# Patient Record
Sex: Female | Born: 1935
Health system: Southern US, Community
[De-identification: ages and names within clinical notes are randomized; demographics above are authoritative.]

## PROBLEM LIST (undated history)

## (undated) DIAGNOSIS — G2581 Restless legs syndrome: Secondary | ICD-10-CM

## (undated) DIAGNOSIS — C50412 Malignant neoplasm of upper-outer quadrant of left female breast: Secondary | ICD-10-CM

## (undated) DIAGNOSIS — R9389 Abnormal findings on diagnostic imaging of other specified body structures: Secondary | ICD-10-CM

## (undated) DIAGNOSIS — K219 Gastro-esophageal reflux disease without esophagitis: Secondary | ICD-10-CM

## (undated) DIAGNOSIS — R609 Edema, unspecified: Secondary | ICD-10-CM

## (undated) DIAGNOSIS — R079 Chest pain, unspecified: Secondary | ICD-10-CM

## (undated) DIAGNOSIS — I5032 Chronic diastolic (congestive) heart failure: Secondary | ICD-10-CM

## (undated) DIAGNOSIS — K635 Polyp of colon: Secondary | ICD-10-CM

## (undated) DIAGNOSIS — C50919 Malignant neoplasm of unspecified site of unspecified female breast: Secondary | ICD-10-CM

## (undated) DIAGNOSIS — J449 Chronic obstructive pulmonary disease, unspecified: Secondary | ICD-10-CM

## (undated) DIAGNOSIS — I130 Hypertensive heart and chronic kidney disease with heart failure and stage 1 through stage 4 chronic kidney disease, or unspecified chronic kidney disease: Secondary | ICD-10-CM

## (undated) DIAGNOSIS — I10 Essential (primary) hypertension: Secondary | ICD-10-CM

## (undated) DIAGNOSIS — N189 Chronic kidney disease, unspecified: Secondary | ICD-10-CM

## (undated) DIAGNOSIS — I480 Paroxysmal atrial fibrillation: Secondary | ICD-10-CM

## (undated) DIAGNOSIS — E039 Hypothyroidism, unspecified: Secondary | ICD-10-CM

## (undated) DIAGNOSIS — M5137 Other intervertebral disc degeneration, lumbosacral region: Secondary | ICD-10-CM

## (undated) DIAGNOSIS — R05 Cough: Secondary | ICD-10-CM

## (undated) DIAGNOSIS — E785 Hyperlipidemia, unspecified: Secondary | ICD-10-CM

## (undated) DIAGNOSIS — D510 Vitamin B12 deficiency anemia due to intrinsic factor deficiency: Secondary | ICD-10-CM

## (undated) DIAGNOSIS — M51379 Other intervertebral disc degeneration, lumbosacral region without mention of lumbar back pain or lower extremity pain: Secondary | ICD-10-CM

## (undated) DIAGNOSIS — I6529 Occlusion and stenosis of unspecified carotid artery: Secondary | ICD-10-CM

## (undated) HISTORY — DX: Chronic obstructive pulmonary disease, unspecified: J44.9

## (undated) HISTORY — DX: Abnormal findings on diagnostic imaging of other specified body structures: R93.89

## (undated) HISTORY — DX: Hypothyroidism, unspecified: E03.9

## (undated) HISTORY — DX: Chronic kidney disease, unspecified: N18.9

## (undated) HISTORY — DX: Chest pain, unspecified: R07.9

## (undated) HISTORY — DX: Cough: R05

## (undated) HISTORY — DX: Vitamin B12 deficiency anemia due to intrinsic factor deficiency: D51.0

## (undated) HISTORY — DX: Gastro-esophageal reflux disease without esophagitis: K21.9

## (undated) HISTORY — DX: Edema, unspecified: R60.9

## (undated) HISTORY — DX: Restless legs syndrome: G25.81

## (undated) HISTORY — DX: Chronic diastolic (congestive) heart failure: I50.32

## (undated) HISTORY — DX: Malignant neoplasm of unspecified site of unspecified female breast: C50.919

## (undated) HISTORY — DX: Polyp of colon: K63.5

## (undated) HISTORY — DX: Occlusion and stenosis of unspecified carotid artery: I65.29

## (undated) HISTORY — DX: Paroxysmal atrial fibrillation: I48.0

## (undated) HISTORY — DX: Hyperlipidemia, unspecified: E78.5

## (undated) HISTORY — DX: Essential (primary) hypertension: I10

## (undated) HISTORY — DX: Hypertensive heart and chronic kidney disease with heart failure and stage 1 through stage 4 chronic kidney disease, or unspecified chronic kidney disease: I13.0

## (undated) HISTORY — DX: Other intervertebral disc degeneration, lumbosacral region without mention of lumbar back pain or lower extremity pain: M51.379

## (undated) HISTORY — DX: Malignant neoplasm of upper-outer quadrant of left female breast: C50.412

## (undated) HISTORY — DX: Other intervertebral disc degeneration, lumbosacral region: M51.37

---

## 1968-07-26 HISTORY — PX: ABDOMINAL HYSTERECTOMY: SHX81

## 1978-07-26 HISTORY — PX: SPINE SURGERY: SHX786

## 2000-03-15 ENCOUNTER — Encounter (INDEPENDENT_AMBULATORY_CARE_PROVIDER_SITE_OTHER): Payer: Self-pay

## 2000-03-15 ENCOUNTER — Other Ambulatory Visit: Admission: RE | Admit: 2000-03-15 | Discharge: 2000-03-15 | Payer: Self-pay | Admitting: Internal Medicine

## 2007-07-27 HISTORY — PX: BREAST MASS EXCISION: SHX1267

## 2008-06-05 ENCOUNTER — Ambulatory Visit: Payer: Self-pay | Admitting: Internal Medicine

## 2008-06-18 ENCOUNTER — Encounter: Payer: Self-pay | Admitting: Internal Medicine

## 2008-06-18 ENCOUNTER — Ambulatory Visit: Payer: Self-pay | Admitting: Internal Medicine

## 2008-06-21 ENCOUNTER — Encounter: Payer: Self-pay | Admitting: Internal Medicine

## 2012-05-01 ENCOUNTER — Encounter: Payer: Self-pay | Admitting: Internal Medicine

## 2012-05-02 ENCOUNTER — Encounter: Payer: Self-pay | Admitting: Internal Medicine

## 2012-05-02 ENCOUNTER — Ambulatory Visit (INDEPENDENT_AMBULATORY_CARE_PROVIDER_SITE_OTHER): Payer: Medicare Other | Admitting: Internal Medicine

## 2012-05-02 VITALS — BP 124/82 | HR 63 | Temp 98.1°F | Ht 65.0 in | Wt 193.0 lb

## 2012-05-02 DIAGNOSIS — R9389 Abnormal findings on diagnostic imaging of other specified body structures: Secondary | ICD-10-CM

## 2012-05-02 DIAGNOSIS — R05 Cough: Secondary | ICD-10-CM

## 2012-05-02 DIAGNOSIS — R059 Cough, unspecified: Secondary | ICD-10-CM

## 2012-05-02 HISTORY — DX: Cough, unspecified: R05.9

## 2012-05-02 HISTORY — DX: Abnormal findings on diagnostic imaging of other specified body structures: R93.89

## 2012-05-02 MED ORDER — OMEPRAZOLE 20 MG PO CPDR: 20.0000 mg | DELAYED_RELEASE_CAPSULE | Freq: Every day | ORAL | Status: DC

## 2012-05-02 MED ORDER — FAMOTIDINE 20 MG PO TABS: ORAL_TABLET | ORAL | Status: DC

## 2012-05-02 NOTE — Progress Notes (Signed)
  Subjective:    Patient ID: Melanie Montes, female    DOB: 10-Oct-1935  MRN: [id number]  HPI  70 yowf quit smoking with cough improvement in 2002 referred 05/02/2012 to pulmonary clinic By Dr Delena Bali with new onset hemoptysis.   05/02/2012 1st pulmonary ov/ Alvino Lechuga cc one episode of ? Hemoptysis 6 months prior to OV  Then recurred on Sept 23 more with throat clearing while on baby aspirin which stopped for a week then resumed. Since sept 23 persistent cough and burning in throat with yellow mucus started on levaquin 10/8.  No assoc sob or epistaxis though does c/o seasonal nasal congestion, acitive, assoc with watery discharge. Convinced blood came from throat, not lungs or nose. No obvious daytime variabilty or assoc  cp or chest tightness, subjective wheeze overt sinus or hb symptoms. No unusual exp hx or h/o childhood pna/ asthma or premature birth to her knowledge.   Sleeping ok without nocturnal  or early am exacerbation  of respiratory  c/o's or need for noct saba. Also denies any obvious fluctuation of symptoms with weather or environmental changes or other aggravating or alleviating factors except as outlined above    Review of Systems  Constitutional: Negative for fever, chills and unexpected weight change.  HENT: Positive for congestion, sore throat, rhinorrhea and postnasal drip. Negative for ear pain, nosebleeds, sneezing, trouble swallowing, dental problem, voice change and sinus pressure.   Eyes: Negative for visual disturbance.  Respiratory: Positive for cough. Negative for choking and shortness of breath.   Cardiovascular: Negative for chest pain and leg swelling.  Gastrointestinal: Negative for vomiting, abdominal pain and diarrhea.  Genitourinary: Negative for difficulty urinating.  Musculoskeletal: Negative for arthralgias.  Skin: Negative for rash.  Neurological: Negative for tremors, syncope and headaches.  Hematological: Does not bruise/bleed easily.       Objective:   Physical Exam  amb wf nad  HEENT mild turbinate edema.  Oropharynx no thrush or excess pnd or cobblestoning. Full dentures. No JVD or cervical adenopathy. Mild accessory muscle hypertrophy. Trachea midline, nl thryroid. Chest was hyperinflated by percussion with diminished breath sounds and moderate increased exp time without wheeze. Hoover sign positive at mid inspiration. Regular rate and rhythm without murmur gallop or rub or increase P2 or edema.  Abd: no hsm, nl excursion. Ext warm without cyanosis or clubbing.    Outside cxr reports reviewed > wnl      Assessment & Plan:

## 2012-05-02 NOTE — Patient Instructions (Addendum)
Try prilosec 20mg   Take 30-60 min before first meal of the day and Pepcid 20 mg one bedtime until cough is completely gone for at least a week without the need for cough suppression  Finish the levaquin - stop for aches joints, especially joints or numbness   GERD (REFLUX)  is an extremely common cause of respiratory symptoms, many times with no significant heartburn at all.    It can be treated with medication, but also with lifestyle changes including avoidance of late meals, excessive alcohol, smoking cessation, and avoid fatty foods, chocolate, peppermint, colas, red wine, and acidic juices such as orange juice.  NO MINT OR MENTHOL PRODUCTS SO NO COUGH DROPS  USE SUGARLESS CANDY INSTEAD (jolley ranchers or Stover's)  NO OIL BASED VITAMINS and supplements- use powdered substitute   Please schedule a follow up office visit in 4 weeks, sooner if needed with pft's and cxr

## 2012-05-03 NOTE — Assessment & Plan Note (Signed)
-   CT Chest 04/17/12:  Bilateral clustered nodules both upper lobes and sup segment of lower lobes  Most likely has low grade mai perhaps with hemoptysis on this basis but agree with short term levquin only using the concept of the punishment needs to fit the crime and for right now persuing these nodular densities is not in her best interest in this context.

## 2012-05-03 NOTE — Assessment & Plan Note (Signed)
The most common causes of chronic cough in immunocompetent adults include the following: upper airway cough syndrome (UACS), previously referred to as postnasal drip syndrome (PNDS), which is caused by variety of rhinosinus conditions; (2) asthma; (3) GERD; (4) chronic bronchitis from cigarette smoking or other inhaled environmental irritants; (5) nonasthmatic eosinophilic bronchitis; and (6) bronchiectasis.   These conditions, singly or in combination, have accounted for up to 94% of the causes of chronic cough in prospective studies.   Other conditions have constituted no >6% of the causes in prospective studies These have included bronchogenic carcinoma, chronic interstitial pneumonia, sarcoidosis, left ventricular failure, ACEI-induced cough, and aspiration from a condition associated with pharyngeal dysfunction.   Most likely this is  Classic Upper airway cough syndrome, so named because it's frequently impossible to sort out how much is  CR/sinusitis with freq throat clearing (which can be related to primary GERD)   vs  causing  secondary (" extra esophageal")  GERD from wide swings in gastric pressure that occur with throat clearing, often  promoting self use of mint and menthol lozenges that reduce the lower esophageal sphincter tone and exacerbate the problem further in a cyclical fashion.   These are the same pts (now being labeled as having "irritable larynx syndrome" by some cough centers) who not infrequently have a history of having failed to tolerate ace inhibitors,  dry powder inhalers or biphosphonates or report having atypical reflux symptoms that don't respond to standard doses of PPI , and are easily confused as having aecopd or asthma flares by even experienced allergists/ pulmonologists. rx for gerd with ppi and diet and then regroup with pft's and cxr in 4-6 weeks

## 2012-06-02 ENCOUNTER — Ambulatory Visit (INDEPENDENT_AMBULATORY_CARE_PROVIDER_SITE_OTHER): Payer: Medicare Other | Admitting: Internal Medicine

## 2012-06-02 ENCOUNTER — Ambulatory Visit (INDEPENDENT_AMBULATORY_CARE_PROVIDER_SITE_OTHER)
Admission: RE | Admit: 2012-06-02 | Discharge: 2012-06-02 | Disposition: A | Discharge status: Home / Self Care | Payer: Medicare Other | Source: Ambulatory Visit | Attending: Internal Medicine | Admitting: Internal Medicine

## 2012-06-02 ENCOUNTER — Encounter: Payer: Self-pay | Admitting: Internal Medicine

## 2012-06-02 VITALS — BP 106/60 | HR 69 | Temp 97.7°F | Ht 65.0 in | Wt 192.0 lb

## 2012-06-02 DIAGNOSIS — R9389 Abnormal findings on diagnostic imaging of other specified body structures: Secondary | ICD-10-CM

## 2012-06-02 DIAGNOSIS — R05 Cough: Secondary | ICD-10-CM

## 2012-06-02 DIAGNOSIS — J449 Chronic obstructive pulmonary disease, unspecified: Secondary | ICD-10-CM | POA: Insufficient documentation

## 2012-06-02 HISTORY — DX: Chronic obstructive pulmonary disease, unspecified: J44.9

## 2012-06-02 LAB — PULMONARY FUNCTION TEST

## 2012-06-02 IMAGING — CR DG CHEST 2V
2 series · 2 of 2 positions shown · non-contrast
Comparison: None.

CLINICAL DATA: Cough.  Follow up pneumonia.

CHEST - 2 VIEW

[view not recorded (1 of 2)]
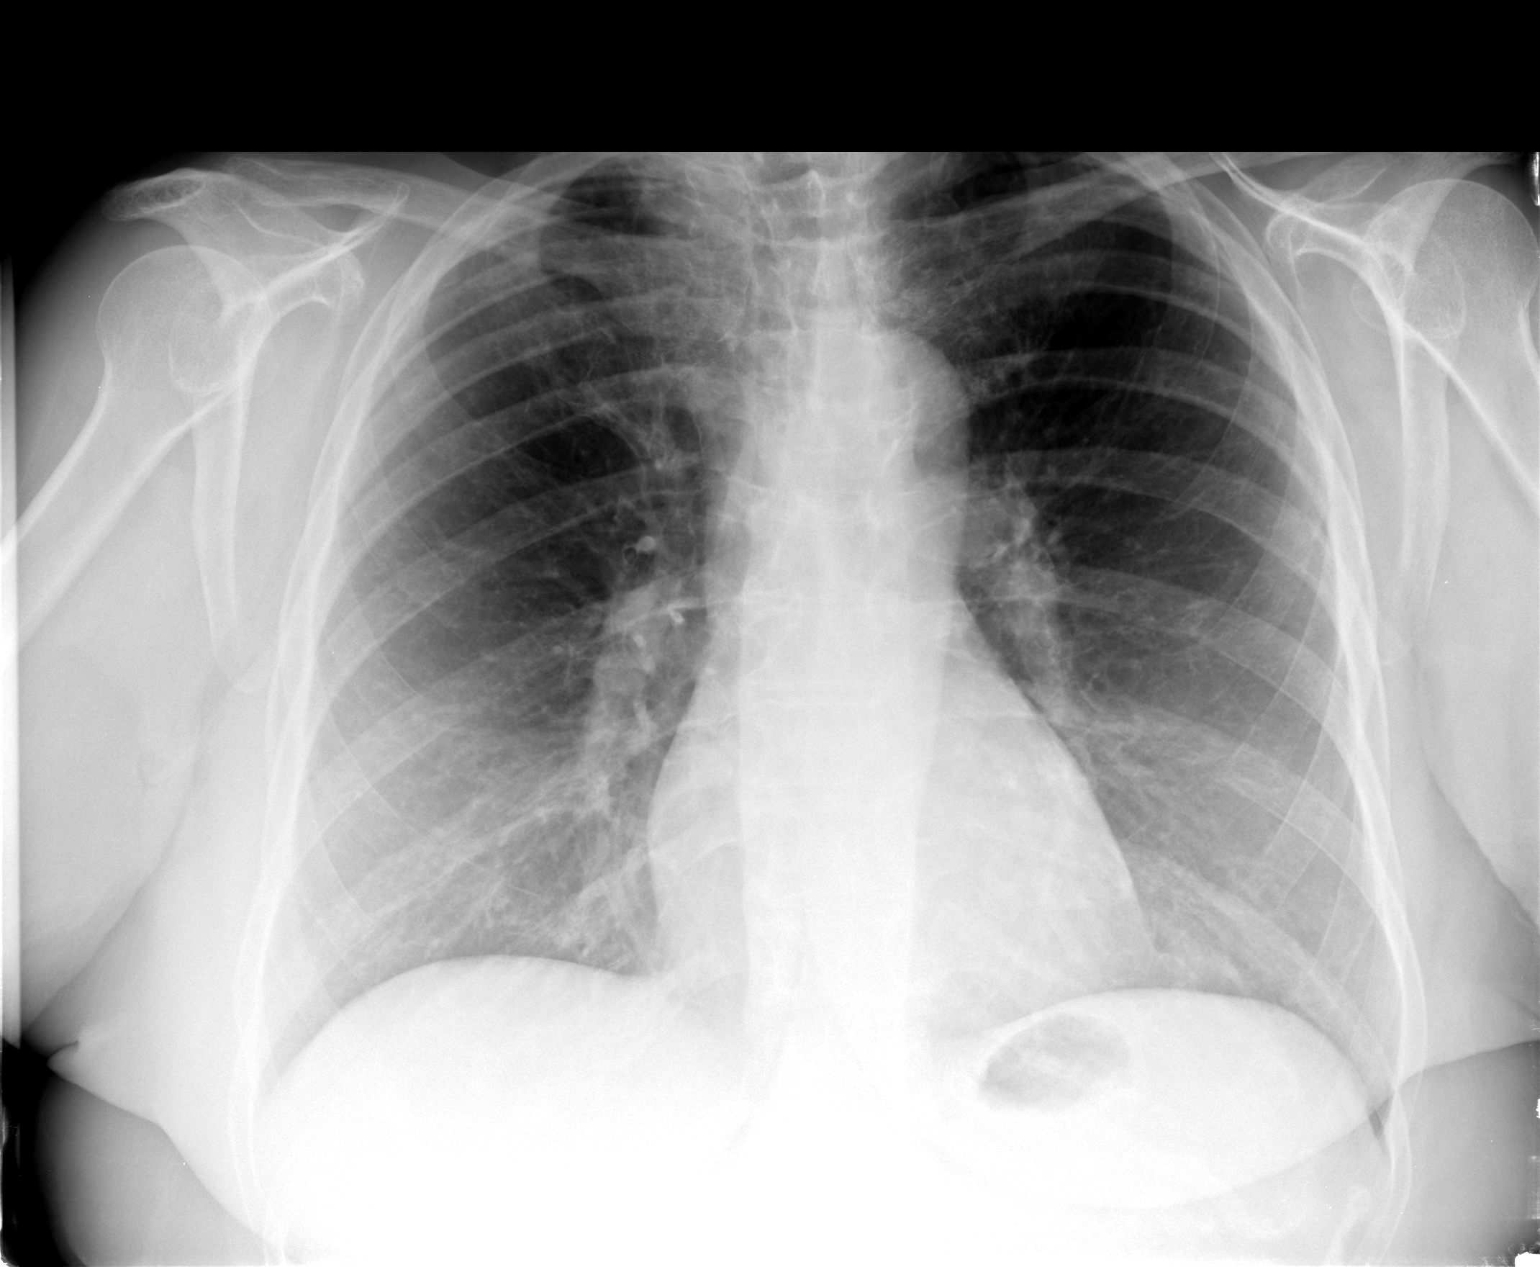

[view not recorded (2 of 2)]
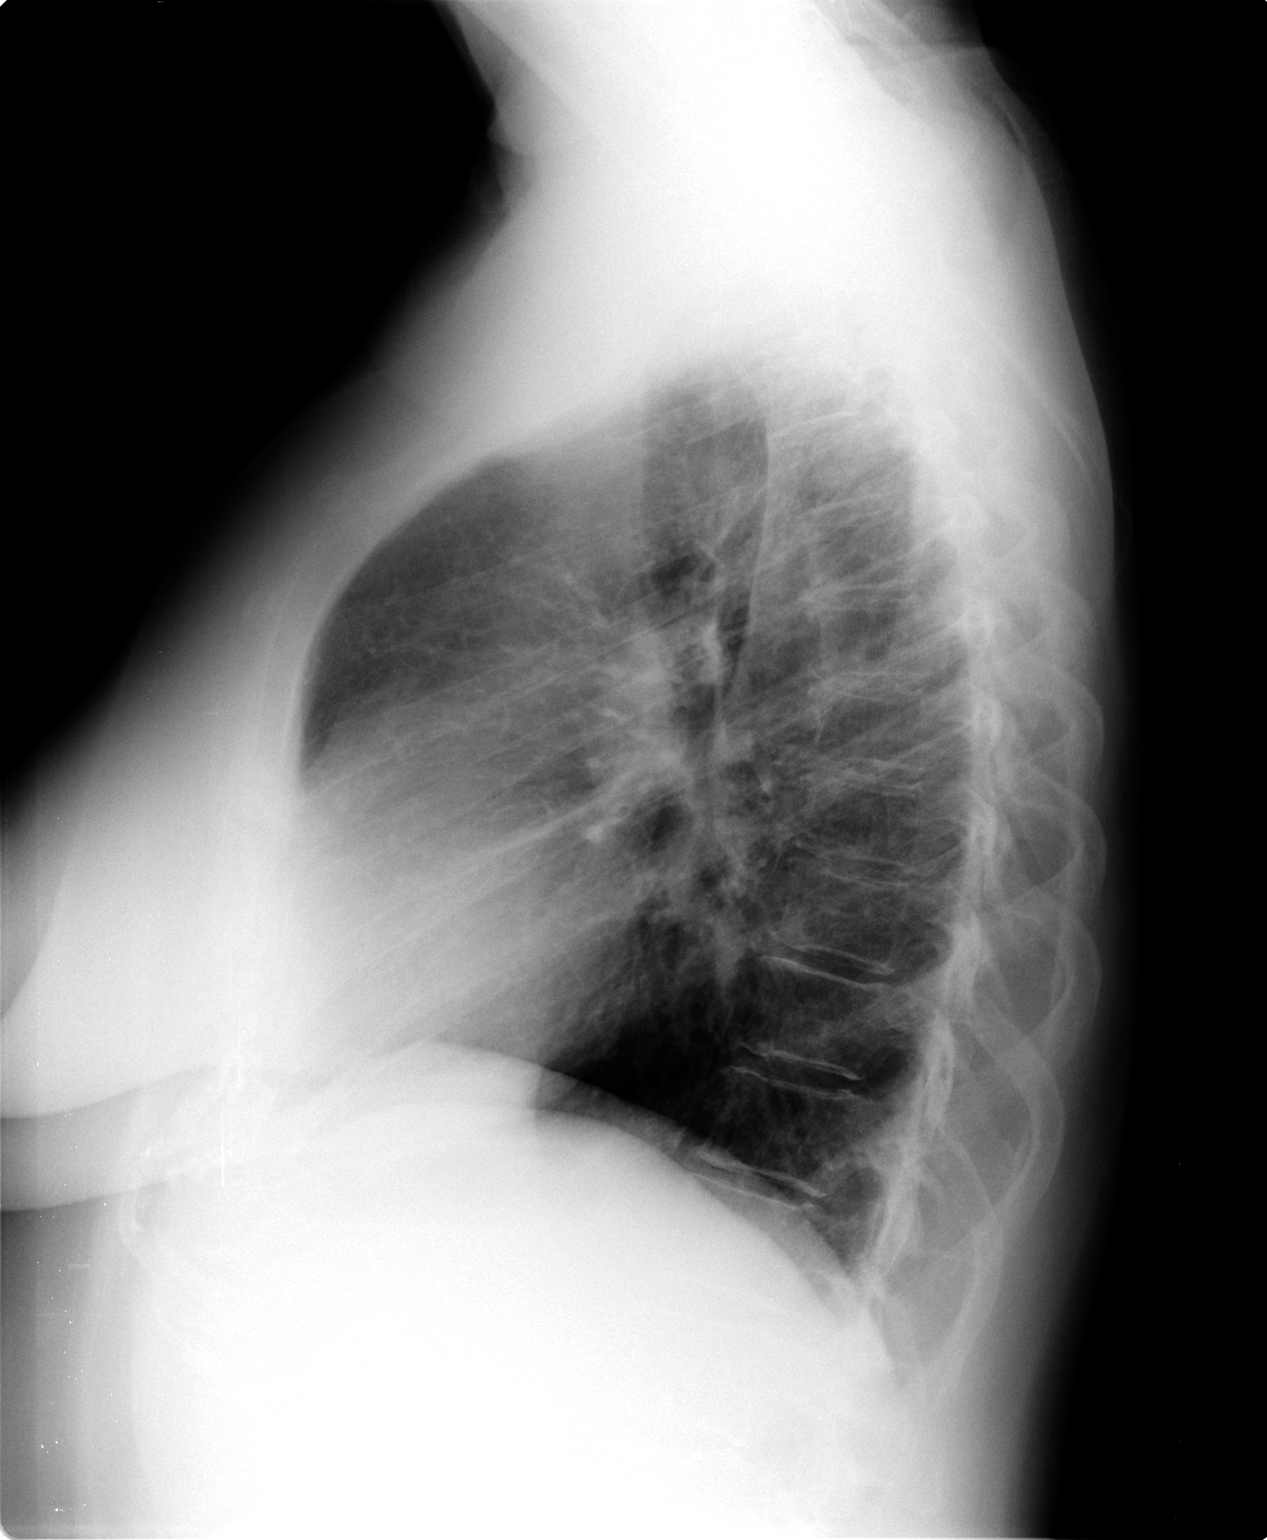

[2 of 2 positions shown; findings below may reference images not displayed]

FINDINGS: Cardiopericardial silhouette within normal limits.
Mediastinal contours normal. Trachea midline.  No airspace disease
or effusion.
IMPRESSION: No active cardiopulmonary disease.

## 2012-06-02 NOTE — Assessment & Plan Note (Signed)
-   CT Chest 04/17/12:  Bilateral clustered nodules both upper lobes and sup segment of lower lobes  Very well could have MAI but not viz on plain cxr and no indication for rx even if she has it  Therefore rec recheck cxr in 6 months

## 2012-06-02 NOTE — Progress Notes (Signed)
  Subjective:    Patient ID: Melanie Montes, female    DOB: [birth year]  MRN: [id number]  HPI  38 yowf quit smoking with cough improvement in 2002 referred 05/02/2012 to pulmonary clinic By Dr Delena Bali with new onset hemoptysis.   05/02/2012 1st pulmonary ov/ Melanie Montes cc one episode of ? Hemoptysis 6 months prior to OV  Then recurred on Sept 23 more with throat clearing while on baby aspirin which stopped for a week then resumed. Since sept 23 persistent cough and burning in throat with yellow mucus started on levaquin 10/8.  No assoc sob or epistaxis though does c/o seasonal nasal congestion, acitive, assoc with watery discharge. Convinced blood came from throat, not lungs or nose. rec Try prilosec 20mg   Take 30-60 min before first meal of the day and Pepcid 20 mg one bedtime until cough is completely gone for at least a week without the need for cough suppression Finish the levaquin - stop for aches joints, especially joints or numbness  GERD (REFLUX)  06/02/2012 f/u ov/Melanie Montes cc 100% better, no cough, no more hemoptysis back on baby asa.  No cp or chest tightness, subjective wheeze overt sinus or hb symptoms. No unusual exp hx or h/o childhood pna/ asthma or premature birth to her knowledge.   Sleeping ok without nocturnal  or early am exacerbation  of respiratory  c/o's or need for noct saba. Also denies any obvious fluctuation of symptoms with weather or environmental changes or other aggravating or alleviating factors except as outlined above          Objective:   Physical Exam  Wt Readings from Last 3 Encounters:  06/02/12 192 lb (87.091 kg)  05/02/12 193 lb (87.544 kg)     amb wf nad  HEENT mild turbinate edema.  Oropharynx no thrush or excess pnd or cobblestoning. Full dentures. No JVD or cervical adenopathy. Mild accessory muscle hypertrophy. Trachea midline, nl thryroid. Chest was hyperinflated by percussion with diminished breath sounds and moderate increased exp time without  wheeze. Hoover sign positive at mid inspiration. Regular rate and rhythm without murmur gallop or rub or increase P2 or edema.  Abd: no hsm, nl excursion. Ext warm without cyanosis or clubbing.     CXR  06/02/2012 : No active cardiopulmonary disease.        Assessment & Plan:

## 2012-06-02 NOTE — Patient Instructions (Addendum)
You will need a follow up cxr in 6 months (placed on recall file)  In the meantime if the bleeding comes back call me to set up a look at your bronchial tubes (bronchoscopy) at [facility]

## 2012-06-02 NOTE — Assessment & Plan Note (Signed)
Resolved with rx of gerd, c/w  Classic Upper airway cough syndrome, so named because it's frequently impossible to sort out how much is  CR/sinusitis with freq throat clearing (which can be related to primary GERD)   vs  causing  secondary (" extra esophageal")  GERD from wide swings in gastric pressure that occur with throat clearing, often  promoting self use of mint and menthol lozenges that reduce the lower esophageal sphincter tone and exacerbate the problem further in a cyclical fashion.   These are the same pts (now being labeled as having "irritable larynx syndrome" by some cough centers) who not infrequently have a history of having failed to tolerate ace inhibitors,  dry powder inhalers or biphosphonates or report having atypical reflux symptoms that don't respond to standard doses of PPI , and are easily confused as having aecopd or asthma flares by even experienced allergists/ pulmonologists.   For now continue gerd rx

## 2012-06-02 NOTE — Assessment & Plan Note (Signed)
-   PFT's 06/02/2012 FEV1  1.72 (86%) ratio 68 with marked air trapping  And DLCO 67%  I reviewed the Flethcher curve with patient that basically indicates  if you quit smoking when your best day FEV1 is still well preserved (which she did)  it is highly unlikely you will progress to severe disease and informed the patient there was no medication on the market that has proven to change the curve or the likelihood of progression.  Therefore stopping smoking and maintaining abstinence is the most important aspect of care, not choice of inhalers or for that matter, doctors.    Pulmonary f/u and meds are prn in this setting

## 2012-06-02 NOTE — Progress Notes (Signed)
PFT done today. Katie Welchel,CMA  

## 2012-10-26 ENCOUNTER — Telehealth: Payer: Self-pay | Admitting: *Deleted

## 2012-10-26 DIAGNOSIS — R9389 Abnormal findings on diagnostic imaging of other specified body structures: Secondary | ICD-10-CM

## 2012-10-26 DIAGNOSIS — R05 Cough: Secondary | ICD-10-CM

## 2012-10-26 NOTE — Telephone Encounter (Signed)
Spoke with pt and she agrees to have cxr done, but wants this done in [place] at [facility] I will send order to [facility] and advised we will call her with results once he reviews She verbalized understanding and denies any questions

## 2012-10-26 NOTE — Telephone Encounter (Signed)
Message copied by Rosana Berger on Thu Oct 26, 2012  9:06 AM ------      Message from: Tanda Rockers      Created: Fri Jun 02, 2012 10:07 AM       Make sure she's a cxr by now (ok to do in [place]) ------

## 2012-12-28 ENCOUNTER — Ambulatory Visit (INDEPENDENT_AMBULATORY_CARE_PROVIDER_SITE_OTHER): Payer: Medicare Other | Admitting: Internal Medicine

## 2012-12-28 ENCOUNTER — Encounter: Payer: Self-pay | Admitting: Internal Medicine

## 2012-12-28 VITALS — BP 140/84 | HR 73 | Temp 98.0°F | Ht 64.0 in | Wt 210.6 lb

## 2012-12-28 DIAGNOSIS — R9389 Abnormal findings on diagnostic imaging of other specified body structures: Secondary | ICD-10-CM

## 2012-12-28 NOTE — Progress Notes (Signed)
  Subjective:    Patient ID: Melanie Montes, female    DOB: May 05, 1936  MRN: [id number]  HPI  20 yowf quit smoking with cough improvement in 2002 referred 05/02/2012 to pulmonary clinic By Dr Delena Bali with new onset hemoptysis.   05/02/2012 1st pulmonary ov/ Melanie Montes cc one episode of ? Hemoptysis 6 months prior to OV  Then recurred on Sept 23 more with throat clearing while on baby aspirin which stopped for a week then resumed. Since sept 23 persistent cough and burning in throat with yellow mucus started on levaquin 10/8.  No assoc sob or epistaxis though does c/o seasonal nasal congestion, acitive, assoc with watery discharge. Convinced blood came from throat, not lungs or nose. rec Try prilosec 20mg   Take 30-60 min before first meal of the day and Pepcid 20 mg one bedtime until cough is completely gone for at least a week without the need for cough suppression Finish the levaquin - stop for aches joints, especially joints or numbness  GERD (REFLUX)  06/02/2012 f/u ov/Melanie Montes cc 100% better, no cough, no more hemoptysis back on baby asa. rec No change rx    12/28/2012 f/u ov/Melanie Montes re mpn/ cough Chief Complaint  Patient presents with  . Follow-up    Pt reports coughing is gone-- denies any other concerns at this time      No cp or chest tightness, subjective wheeze overt sinus or hb symptoms. No unusual exp hx or h/o childhood pna/ asthma or premature birth to her knowledge.   Sleeping ok without nocturnal  or early am exacerbation  of respiratory  c/o's or need for noct saba. Also denies any obvious fluctuation of symptoms with weather or environmental changes or other aggravating or alleviating factors except as outlined above.  Current Medications, Allergies, Past Medical History, Past Surgical History, Family History, and Social History were reviewed in [employer] record.  ROS  The following are not active complaints unless bolded sore throat, dysphagia, dental  problems, itching, sneezing,  nasal congestion or excess/ purulent secretions, ear ache,   fever, chills, sweats, unintended wt loss, pleuritic or exertional cp, hemoptysis,  orthopnea pnd or leg swelling, presyncope, palpitations, heartburn, abdominal pain, anorexia, nausea, vomiting, diarrhea  or change in bowel or urinary habits, change in stools or urine, dysuria,hematuria,  rash, arthralgias, visual complaints, headache, numbness weakness or ataxia or problems with walking or coordination,  change in mood/affect or memory.              Objective:   Physical Exam  12/28/2012         210  Wt Readings from Last 3 Encounters:  06/02/12 192 lb (87.091 kg)  05/02/12 193 lb (87.544 kg)     amb wf nad  HEENT mild turbinate edema.  Oropharynx no thrush or excess pnd or cobblestoning. Full dentures. No JVD or cervical adenopathy. Mild accessory muscle hypertrophy. Trachea midline, nl thryroid. Chest was hyperinflated by percussion with diminished breath sounds and moderate increased exp time without wheeze. Hoover sign positive at mid inspiration. Regular rate and rhythm without murmur gallop or rub or increase P2 or edema.  Abd: no hsm, nl excursion. Ext warm without cyanosis or clubbing.     CXR  06/02/2012 : No active cardiopulmonary disease.        Assessment & Plan:

## 2012-12-28 NOTE — Patient Instructions (Addendum)
I recommend a cxr at 6 months then yearly   Return here if cough recurs or any changes on your chest xray.

## 2012-12-29 NOTE — Assessment & Plan Note (Signed)
-   CT Chest 04/17/12:  Bilateral clustered nodules both upper lobes and sup segment of lower lobes  I had an extended summary  discussion with the patient today lasting 15 to 20 minutes of a 25 minute visit on the following issues:  We see scores of pts with MPN many of whom probably have low grade MAI but unless there is convincing clinical deterioration or plain film abnormalities that evolve over time there is not indication for treatment so there is no indication for invasive procedure to document the cause.  Therefore a cxr in 6 months then yearly is all I would recommend in the absence of recurrent symptoms of cough, wt loss, worse doe et.  Pulmonary f/u can be prn

## 2013-05-16 ENCOUNTER — Encounter: Payer: Self-pay | Admitting: Internal Medicine

## 2013-11-16 ENCOUNTER — Encounter: Payer: Self-pay | Admitting: Internal Medicine

## 2013-12-25 ENCOUNTER — Ambulatory Visit (AMBULATORY_SURGERY_CENTER): Payer: Medicare Other

## 2013-12-25 VITALS — Ht 64.25 in | Wt 195.0 lb

## 2013-12-25 DIAGNOSIS — Z860100 Personal history of colon polyps, unspecified: Secondary | ICD-10-CM

## 2013-12-25 MED ORDER — MOVIPREP 100 G PO SOLR: 1.0000 | Freq: Once | ORAL | Status: DC

## 2013-12-25 NOTE — Progress Notes (Signed)
No allergies to eggs or soy No home oxygen No diet/weight loss meds No email No past problems with anesthesia

## 2014-01-09 ENCOUNTER — Encounter: Payer: Self-pay | Admitting: Internal Medicine

## 2014-01-09 ENCOUNTER — Ambulatory Visit (AMBULATORY_SURGERY_CENTER): Payer: Medicare Other | Admitting: Internal Medicine

## 2014-01-09 VITALS — BP 150/67 | HR 68 | Temp 98.2°F | Resp 18 | Ht 64.0 in | Wt 195.0 lb

## 2014-01-09 DIAGNOSIS — Z86010 Personal history of colonic polyps: Secondary | ICD-10-CM

## 2014-01-09 DIAGNOSIS — D126 Benign neoplasm of colon, unspecified: Secondary | ICD-10-CM

## 2014-01-09 MED ORDER — SODIUM CHLORIDE 0.9 % IV SOLN: 500.0000 mL | INTRAVENOUS | Status: DC

## 2014-01-09 NOTE — Progress Notes (Signed)
Called to room to assist during endoscopic procedure.  Patient ID and intended procedure confirmed with present staff. Received instructions for my participation in the procedure from the performing physician.  

## 2014-01-09 NOTE — Progress Notes (Signed)
Procedure ends, to recovery, report given and VSS. 

## 2014-01-09 NOTE — Op Note (Signed)
[place]  [address]. [place], [id number]   COLONOSCOPY PROCEDURE REPORT  PATIENT: Melanie Montes, Melanie Montes  MR#: [id number] BIRTHDATE: 08/20/1935 , 77  yrs. old GENDER: Female ENDOSCOPIST: Eustace Quail, MD REFERRED [id number] Program Recall PROCEDURE DATE:  01/09/2014 PROCEDURE:   Colonoscopy with snare polypectomy x 4 First Screening Colonoscopy - Avg.  risk and is 50 yrs.  old or older - No.  Prior Negative Screening - Now for repeat screening. N/A  History of Adenoma - Now for follow-up colonoscopy & has been > or = to 3 yrs.  Yes hx of adenoma.  Has been 3 or more years since last colonoscopy.  Polyps Removed Today? Yes. ASA CLASS:   Class II INDICATIONS:Patient's father with CRC and  personal history of adenomatous polyps (Multiple prior 1998 --->2009). MEDICATIONS: MAC sedation, administered by CRNA and propofol (Diprivan) 300mg  IV  DESCRIPTION OF PROCEDURE:   After the risks benefits and alternatives of the procedure were thoroughly explained, informed consent was obtained.  A digital rectal exam revealed no abnormalities of the rectum.   The LB [id number]  endoscope was introduced through the anus and advanced to the cecum, which was identified by both the appendix and ileocecal valve. No adverse events experienced.   The quality of the prep was excellent, using MoviPrep  The instrument was then slowly withdrawn as the colon was fully examined.  COLON FINDINGS: Four diminutive polyps were found at the cecum, in the ascending colon, and transverse colon.  A polypectomy was performed with a cold snare.  The resection was complete and the polyp tissue was completely retrieved.   Moderate diverticulosis was noted in the sigmoid colon.   The colon mucosa was otherwise normal.  Retroflexed views revealed no abnormalities. The time to cecum=2 minutes 38 seconds.  Withdrawal time=9 minutes 57 seconds. The scope was withdrawn and the procedure  completed. COMPLICATIONS: There were no complications.  ENDOSCOPIC IMPRESSION: 1.   Four diminutive polyps were found in the colon; polypectomy was performed with a cold snare 2.   Moderate diverticulosis was noted in the sigmoid colon 3.   The colon mucosa was otherwise normal  RECOMMENDATIONS: 1. Follow up colonoscopy in 5 years   eSigned:  Eustace Quail, MD 01/09/2014 11:06 AM   cc: The Patient and Nelda Bucks, MD

## 2014-01-09 NOTE — Patient Instructions (Signed)
Discharge instructions given with verbal understanding. Handout on polyps. Resume previous medications. YOU HAD AN ENDOSCOPIC PROCEDURE TODAY AT THE [facility]: Refer to the procedure report that was given to you for any specific questions about what was found during the examination.  If the procedure report does not answer your questions, please call your gastroenterologist to clarify.  If you requested that your care partner not be given the details of your procedure findings, then the procedure report has been included in a sealed envelope for you to review at your convenience later.  YOU SHOULD EXPECT: Some feelings of bloating in the abdomen. Passage of more gas than usual.  Walking can help get rid of the air that was put into your GI tract during the procedure and reduce the bloating. If you had a lower endoscopy (such as a colonoscopy or flexible sigmoidoscopy) you may notice spotting of blood in your stool or on the toilet paper. If you underwent a bowel prep for your procedure, then you may not have a normal bowel movement for a few days.  DIET: Your first meal following the procedure should be a light meal and then it is ok to progress to your normal diet.  A half-sandwich or bowl of soup is an example of a good first meal.  Heavy or fried foods are harder to digest and may make you feel nauseous or bloated.  Likewise meals heavy in dairy and vegetables can cause extra gas to form and this can also increase the bloating.  Drink plenty of fluids but you should avoid alcoholic beverages for 24 hours.  ACTIVITY: Your care partner should take you home directly after the procedure.  You should plan to take it easy, moving slowly for the rest of the day.  You can resume normal activity the day after the procedure however you should NOT DRIVE or use heavy machinery for 24 hours (because of the sedation medicines used during the test).    SYMPTOMS TO REPORT IMMEDIATELY: A  gastroenterologist can be reached at any hour.  During normal business hours, 8:30 AM to 5:00 PM Monday through Friday, call [phone].  After hours and on weekends, please call the GI answering service at [phone] who will take a message and have the physician on call contact you.   Following lower endoscopy (colonoscopy or flexible sigmoidoscopy):  Excessive amounts of blood in the stool  Significant tenderness or worsening of abdominal pains  Swelling of the abdomen that is new, acute  Fever of 100F or higher  FOLLOW UP: If any biopsies were taken you will be contacted by phone or by letter within the next 1-3 weeks.  Call your gastroenterologist if you have not heard about the biopsies in 3 weeks.  Our staff will call the home number listed on your records the next business day following your procedure to check on you and address any questions or concerns that you may have at that time regarding the information given to you following your procedure. This is a courtesy call and so if there is no answer at the home number and we have not heard from you through the emergency physician on call, we will assume that you have returned to your regular daily activities without incident.  SIGNATURES/CONFIDENTIALITY: You and/or your care partner have signed paperwork which will be entered into your electronic medical record.  These signatures attest to the fact that that the information above on your After Visit Summary has been   reviewed and is understood.  Full responsibility of the confidentiality of this discharge information lies with you and/or your care-partner. 

## 2014-01-10 ENCOUNTER — Telehealth: Payer: Self-pay | Admitting: *Deleted

## 2014-01-10 NOTE — Telephone Encounter (Signed)
  Follow up Call-  Call back number 01/09/2014  Post procedure Call Back phone  # [phone]  Permission to leave phone message Yes     Patient questions:  Do you have a fever, pain , or abdominal swelling? no Pain Score  0 *  Have you tolerated food without any problems? yes  Have you been able to return to your normal activities? yes  Do you have any questions about your discharge instructions: Diet   no Medications  no Follow up visit  no  Do you have questions or concerns about your Care? no  Actions: * If pain score is 4 or above: No action needed, pain <4.

## 2014-01-15 ENCOUNTER — Encounter: Payer: Self-pay | Admitting: Internal Medicine

## 2014-08-12 DIAGNOSIS — Z170 Estrogen receptor positive status [ER+]: Secondary | ICD-10-CM | POA: Diagnosis not present

## 2014-08-12 DIAGNOSIS — Z853 Personal history of malignant neoplasm of breast: Secondary | ICD-10-CM | POA: Diagnosis not present

## 2014-08-12 DIAGNOSIS — D518 Other vitamin B12 deficiency anemias: Secondary | ICD-10-CM | POA: Diagnosis not present

## 2014-09-06 DIAGNOSIS — Z6832 Body mass index (BMI) 32.0-32.9, adult: Secondary | ICD-10-CM | POA: Diagnosis not present

## 2014-09-06 DIAGNOSIS — E785 Hyperlipidemia, unspecified: Secondary | ICD-10-CM | POA: Diagnosis not present

## 2014-09-06 DIAGNOSIS — N183 Chronic kidney disease, stage 3 (moderate): Secondary | ICD-10-CM | POA: Diagnosis not present

## 2014-09-06 DIAGNOSIS — I10 Essential (primary) hypertension: Secondary | ICD-10-CM | POA: Diagnosis not present

## 2014-09-06 DIAGNOSIS — D510 Vitamin B12 deficiency anemia due to intrinsic factor deficiency: Secondary | ICD-10-CM | POA: Diagnosis not present

## 2014-09-06 DIAGNOSIS — I5032 Chronic diastolic (congestive) heart failure: Secondary | ICD-10-CM | POA: Diagnosis not present

## 2014-09-06 DIAGNOSIS — Z760 Encounter for issue of repeat prescription: Secondary | ICD-10-CM | POA: Diagnosis not present

## 2014-09-11 DIAGNOSIS — Z170 Estrogen receptor positive status [ER+]: Secondary | ICD-10-CM | POA: Diagnosis not present

## 2014-09-11 DIAGNOSIS — C50412 Malignant neoplasm of upper-outer quadrant of left female breast: Secondary | ICD-10-CM | POA: Diagnosis not present

## 2014-09-11 DIAGNOSIS — D518 Other vitamin B12 deficiency anemias: Secondary | ICD-10-CM | POA: Diagnosis not present

## 2014-10-10 DIAGNOSIS — Z170 Estrogen receptor positive status [ER+]: Secondary | ICD-10-CM | POA: Diagnosis not present

## 2014-10-10 DIAGNOSIS — C50412 Malignant neoplasm of upper-outer quadrant of left female breast: Secondary | ICD-10-CM | POA: Diagnosis not present

## 2014-10-10 DIAGNOSIS — D518 Other vitamin B12 deficiency anemias: Secondary | ICD-10-CM | POA: Diagnosis not present

## 2014-11-11 DIAGNOSIS — Z170 Estrogen receptor positive status [ER+]: Secondary | ICD-10-CM | POA: Diagnosis not present

## 2014-11-11 DIAGNOSIS — D518 Other vitamin B12 deficiency anemias: Secondary | ICD-10-CM | POA: Diagnosis not present

## 2014-11-11 DIAGNOSIS — C50412 Malignant neoplasm of upper-outer quadrant of left female breast: Secondary | ICD-10-CM | POA: Diagnosis not present

## 2014-12-05 DIAGNOSIS — I10 Essential (primary) hypertension: Secondary | ICD-10-CM | POA: Diagnosis not present

## 2014-12-05 DIAGNOSIS — C50912 Malignant neoplasm of unspecified site of left female breast: Secondary | ICD-10-CM | POA: Diagnosis not present

## 2014-12-05 DIAGNOSIS — N183 Chronic kidney disease, stage 3 (moderate): Secondary | ICD-10-CM | POA: Diagnosis not present

## 2014-12-05 DIAGNOSIS — D510 Vitamin B12 deficiency anemia due to intrinsic factor deficiency: Secondary | ICD-10-CM | POA: Diagnosis not present

## 2014-12-05 DIAGNOSIS — I5032 Chronic diastolic (congestive) heart failure: Secondary | ICD-10-CM | POA: Diagnosis not present

## 2014-12-05 DIAGNOSIS — Z6829 Body mass index (BMI) 29.0-29.9, adult: Secondary | ICD-10-CM | POA: Diagnosis not present

## 2014-12-05 DIAGNOSIS — E785 Hyperlipidemia, unspecified: Secondary | ICD-10-CM | POA: Diagnosis not present

## 2014-12-05 DIAGNOSIS — D631 Anemia in chronic kidney disease: Secondary | ICD-10-CM | POA: Diagnosis not present

## 2014-12-10 DIAGNOSIS — Z170 Estrogen receptor positive status [ER+]: Secondary | ICD-10-CM | POA: Diagnosis not present

## 2014-12-10 DIAGNOSIS — C50412 Malignant neoplasm of upper-outer quadrant of left female breast: Secondary | ICD-10-CM | POA: Diagnosis not present

## 2014-12-10 DIAGNOSIS — D518 Other vitamin B12 deficiency anemias: Secondary | ICD-10-CM | POA: Diagnosis not present

## 2014-12-12 DIAGNOSIS — I5030 Unspecified diastolic (congestive) heart failure: Secondary | ICD-10-CM | POA: Diagnosis not present

## 2014-12-12 DIAGNOSIS — R079 Chest pain, unspecified: Secondary | ICD-10-CM | POA: Diagnosis not present

## 2014-12-12 DIAGNOSIS — I10 Essential (primary) hypertension: Secondary | ICD-10-CM | POA: Diagnosis not present

## 2014-12-30 DIAGNOSIS — R079 Chest pain, unspecified: Secondary | ICD-10-CM | POA: Diagnosis not present

## 2014-12-30 DIAGNOSIS — R0600 Dyspnea, unspecified: Secondary | ICD-10-CM | POA: Diagnosis not present

## 2015-01-02 DIAGNOSIS — R922 Inconclusive mammogram: Secondary | ICD-10-CM | POA: Diagnosis not present

## 2015-01-02 DIAGNOSIS — C50919 Malignant neoplasm of unspecified site of unspecified female breast: Secondary | ICD-10-CM | POA: Diagnosis not present

## 2015-01-10 DIAGNOSIS — Z170 Estrogen receptor positive status [ER+]: Secondary | ICD-10-CM | POA: Diagnosis not present

## 2015-01-10 DIAGNOSIS — C50412 Malignant neoplasm of upper-outer quadrant of left female breast: Secondary | ICD-10-CM | POA: Diagnosis not present

## 2015-01-10 DIAGNOSIS — D518 Other vitamin B12 deficiency anemias: Secondary | ICD-10-CM | POA: Diagnosis not present

## 2015-01-14 DIAGNOSIS — C50412 Malignant neoplasm of upper-outer quadrant of left female breast: Secondary | ICD-10-CM | POA: Diagnosis not present

## 2015-01-24 ENCOUNTER — Encounter: Payer: Self-pay | Admitting: Internal Medicine

## 2015-02-10 DIAGNOSIS — Z853 Personal history of malignant neoplasm of breast: Secondary | ICD-10-CM | POA: Diagnosis not present

## 2015-02-10 DIAGNOSIS — D518 Other vitamin B12 deficiency anemias: Secondary | ICD-10-CM | POA: Diagnosis not present

## 2015-02-12 DIAGNOSIS — Z13820 Encounter for screening for osteoporosis: Secondary | ICD-10-CM | POA: Diagnosis not present

## 2015-02-12 DIAGNOSIS — M8589 Other specified disorders of bone density and structure, multiple sites: Secondary | ICD-10-CM | POA: Diagnosis not present

## 2015-03-11 DIAGNOSIS — D510 Vitamin B12 deficiency anemia due to intrinsic factor deficiency: Secondary | ICD-10-CM | POA: Diagnosis not present

## 2015-03-11 DIAGNOSIS — N183 Chronic kidney disease, stage 3 (moderate): Secondary | ICD-10-CM | POA: Diagnosis not present

## 2015-03-11 DIAGNOSIS — D631 Anemia in chronic kidney disease: Secondary | ICD-10-CM | POA: Diagnosis not present

## 2015-03-11 DIAGNOSIS — I10 Essential (primary) hypertension: Secondary | ICD-10-CM | POA: Diagnosis not present

## 2015-03-11 DIAGNOSIS — E785 Hyperlipidemia, unspecified: Secondary | ICD-10-CM | POA: Diagnosis not present

## 2015-03-11 DIAGNOSIS — Z9181 History of falling: Secondary | ICD-10-CM | POA: Diagnosis not present

## 2015-03-11 DIAGNOSIS — I5032 Chronic diastolic (congestive) heart failure: Secondary | ICD-10-CM | POA: Diagnosis not present

## 2015-03-11 DIAGNOSIS — Z1389 Encounter for screening for other disorder: Secondary | ICD-10-CM | POA: Diagnosis not present

## 2015-03-13 DIAGNOSIS — D518 Other vitamin B12 deficiency anemias: Secondary | ICD-10-CM | POA: Diagnosis not present

## 2015-04-07 DIAGNOSIS — S51802A Unspecified open wound of left forearm, initial encounter: Secondary | ICD-10-CM | POA: Diagnosis not present

## 2015-04-07 DIAGNOSIS — Z6830 Body mass index (BMI) 30.0-30.9, adult: Secondary | ICD-10-CM | POA: Diagnosis not present

## 2015-04-07 DIAGNOSIS — Z23 Encounter for immunization: Secondary | ICD-10-CM | POA: Diagnosis not present

## 2015-04-14 DIAGNOSIS — D518 Other vitamin B12 deficiency anemias: Secondary | ICD-10-CM | POA: Diagnosis not present

## 2015-05-13 DIAGNOSIS — Z853 Personal history of malignant neoplasm of breast: Secondary | ICD-10-CM | POA: Diagnosis not present

## 2015-05-13 DIAGNOSIS — D518 Other vitamin B12 deficiency anemias: Secondary | ICD-10-CM | POA: Diagnosis not present

## 2015-06-13 DIAGNOSIS — I10 Essential (primary) hypertension: Secondary | ICD-10-CM | POA: Diagnosis not present

## 2015-06-13 DIAGNOSIS — E785 Hyperlipidemia, unspecified: Secondary | ICD-10-CM | POA: Diagnosis not present

## 2015-06-13 DIAGNOSIS — D518 Other vitamin B12 deficiency anemias: Secondary | ICD-10-CM | POA: Diagnosis not present

## 2015-06-13 DIAGNOSIS — D510 Vitamin B12 deficiency anemia due to intrinsic factor deficiency: Secondary | ICD-10-CM | POA: Diagnosis not present

## 2015-06-13 DIAGNOSIS — D631 Anemia in chronic kidney disease: Secondary | ICD-10-CM | POA: Diagnosis not present

## 2015-06-13 DIAGNOSIS — E669 Obesity, unspecified: Secondary | ICD-10-CM | POA: Diagnosis not present

## 2015-06-13 DIAGNOSIS — I5032 Chronic diastolic (congestive) heart failure: Secondary | ICD-10-CM | POA: Diagnosis not present

## 2015-06-13 DIAGNOSIS — R911 Solitary pulmonary nodule: Secondary | ICD-10-CM | POA: Diagnosis not present

## 2015-06-13 DIAGNOSIS — N183 Chronic kidney disease, stage 3 (moderate): Secondary | ICD-10-CM | POA: Diagnosis not present

## 2015-06-13 DIAGNOSIS — Z23 Encounter for immunization: Secondary | ICD-10-CM | POA: Diagnosis not present

## 2015-06-30 DIAGNOSIS — R079 Chest pain, unspecified: Secondary | ICD-10-CM

## 2015-06-30 DIAGNOSIS — N189 Chronic kidney disease, unspecified: Secondary | ICD-10-CM

## 2015-06-30 DIAGNOSIS — I130 Hypertensive heart and chronic kidney disease with heart failure and stage 1 through stage 4 chronic kidney disease, or unspecified chronic kidney disease: Secondary | ICD-10-CM | POA: Insufficient documentation

## 2015-06-30 DIAGNOSIS — E785 Hyperlipidemia, unspecified: Secondary | ICD-10-CM | POA: Insufficient documentation

## 2015-06-30 DIAGNOSIS — I5032 Chronic diastolic (congestive) heart failure: Secondary | ICD-10-CM

## 2015-06-30 HISTORY — DX: Hypertensive heart and chronic kidney disease with heart failure and stage 1 through stage 4 chronic kidney disease, or unspecified chronic kidney disease: I13.0

## 2015-06-30 HISTORY — DX: Chronic kidney disease, unspecified: N18.9

## 2015-06-30 HISTORY — DX: Chest pain, unspecified: R07.9

## 2015-06-30 HISTORY — DX: Chronic diastolic (congestive) heart failure: I50.32

## 2015-07-02 DIAGNOSIS — N189 Chronic kidney disease, unspecified: Secondary | ICD-10-CM | POA: Diagnosis not present

## 2015-07-02 DIAGNOSIS — I509 Heart failure, unspecified: Secondary | ICD-10-CM | POA: Diagnosis not present

## 2015-07-02 DIAGNOSIS — E785 Hyperlipidemia, unspecified: Secondary | ICD-10-CM | POA: Diagnosis not present

## 2015-07-02 DIAGNOSIS — I5032 Chronic diastolic (congestive) heart failure: Secondary | ICD-10-CM | POA: Diagnosis not present

## 2015-07-02 DIAGNOSIS — I130 Hypertensive heart and chronic kidney disease with heart failure and stage 1 through stage 4 chronic kidney disease, or unspecified chronic kidney disease: Secondary | ICD-10-CM | POA: Diagnosis not present

## 2015-07-02 DIAGNOSIS — I6529 Occlusion and stenosis of unspecified carotid artery: Secondary | ICD-10-CM | POA: Diagnosis not present

## 2015-07-03 DIAGNOSIS — I6529 Occlusion and stenosis of unspecified carotid artery: Secondary | ICD-10-CM | POA: Diagnosis not present

## 2015-07-03 DIAGNOSIS — N189 Chronic kidney disease, unspecified: Secondary | ICD-10-CM | POA: Diagnosis not present

## 2015-07-03 DIAGNOSIS — I509 Heart failure, unspecified: Secondary | ICD-10-CM | POA: Diagnosis not present

## 2015-07-03 DIAGNOSIS — I130 Hypertensive heart and chronic kidney disease with heart failure and stage 1 through stage 4 chronic kidney disease, or unspecified chronic kidney disease: Secondary | ICD-10-CM | POA: Diagnosis not present

## 2015-07-03 DIAGNOSIS — I5032 Chronic diastolic (congestive) heart failure: Secondary | ICD-10-CM | POA: Diagnosis not present

## 2015-07-03 DIAGNOSIS — E785 Hyperlipidemia, unspecified: Secondary | ICD-10-CM | POA: Diagnosis not present

## 2015-07-14 DIAGNOSIS — Z853 Personal history of malignant neoplasm of breast: Secondary | ICD-10-CM | POA: Diagnosis not present

## 2015-07-14 DIAGNOSIS — D518 Other vitamin B12 deficiency anemias: Secondary | ICD-10-CM | POA: Diagnosis not present

## 2015-08-06 DIAGNOSIS — I6529 Occlusion and stenosis of unspecified carotid artery: Secondary | ICD-10-CM | POA: Diagnosis not present

## 2015-08-13 DIAGNOSIS — D518 Other vitamin B12 deficiency anemias: Secondary | ICD-10-CM | POA: Diagnosis not present

## 2015-09-15 DIAGNOSIS — D518 Other vitamin B12 deficiency anemias: Secondary | ICD-10-CM | POA: Diagnosis not present

## 2015-09-16 DIAGNOSIS — N183 Chronic kidney disease, stage 3 (moderate): Secondary | ICD-10-CM | POA: Diagnosis not present

## 2015-09-16 DIAGNOSIS — I5032 Chronic diastolic (congestive) heart failure: Secondary | ICD-10-CM | POA: Diagnosis not present

## 2015-09-16 DIAGNOSIS — D510 Vitamin B12 deficiency anemia due to intrinsic factor deficiency: Secondary | ICD-10-CM | POA: Diagnosis not present

## 2015-09-16 DIAGNOSIS — I10 Essential (primary) hypertension: Secondary | ICD-10-CM | POA: Diagnosis not present

## 2015-09-16 DIAGNOSIS — E785 Hyperlipidemia, unspecified: Secondary | ICD-10-CM | POA: Diagnosis not present

## 2015-09-16 DIAGNOSIS — E669 Obesity, unspecified: Secondary | ICD-10-CM | POA: Diagnosis not present

## 2015-09-16 DIAGNOSIS — Z23 Encounter for immunization: Secondary | ICD-10-CM | POA: Diagnosis not present

## 2015-09-16 DIAGNOSIS — Z6830 Body mass index (BMI) 30.0-30.9, adult: Secondary | ICD-10-CM | POA: Diagnosis not present

## 2015-09-16 DIAGNOSIS — D631 Anemia in chronic kidney disease: Secondary | ICD-10-CM | POA: Diagnosis not present

## 2015-10-10 DIAGNOSIS — D518 Other vitamin B12 deficiency anemias: Secondary | ICD-10-CM | POA: Diagnosis not present

## 2015-10-10 DIAGNOSIS — Z853 Personal history of malignant neoplasm of breast: Secondary | ICD-10-CM | POA: Diagnosis not present

## 2015-11-13 DIAGNOSIS — D518 Other vitamin B12 deficiency anemias: Secondary | ICD-10-CM | POA: Diagnosis not present

## 2015-11-13 DIAGNOSIS — Z853 Personal history of malignant neoplasm of breast: Secondary | ICD-10-CM | POA: Diagnosis not present

## 2015-12-11 DIAGNOSIS — Z853 Personal history of malignant neoplasm of breast: Secondary | ICD-10-CM | POA: Diagnosis not present

## 2015-12-11 DIAGNOSIS — D518 Other vitamin B12 deficiency anemias: Secondary | ICD-10-CM | POA: Diagnosis not present

## 2015-12-16 DIAGNOSIS — I10 Essential (primary) hypertension: Secondary | ICD-10-CM | POA: Diagnosis not present

## 2015-12-16 DIAGNOSIS — E669 Obesity, unspecified: Secondary | ICD-10-CM | POA: Diagnosis not present

## 2015-12-16 DIAGNOSIS — D510 Vitamin B12 deficiency anemia due to intrinsic factor deficiency: Secondary | ICD-10-CM | POA: Diagnosis not present

## 2015-12-16 DIAGNOSIS — D631 Anemia in chronic kidney disease: Secondary | ICD-10-CM | POA: Diagnosis not present

## 2015-12-16 DIAGNOSIS — E785 Hyperlipidemia, unspecified: Secondary | ICD-10-CM | POA: Diagnosis not present

## 2015-12-16 DIAGNOSIS — N183 Chronic kidney disease, stage 3 (moderate): Secondary | ICD-10-CM | POA: Diagnosis not present

## 2015-12-16 DIAGNOSIS — Z6830 Body mass index (BMI) 30.0-30.9, adult: Secondary | ICD-10-CM | POA: Diagnosis not present

## 2015-12-16 DIAGNOSIS — I5032 Chronic diastolic (congestive) heart failure: Secondary | ICD-10-CM | POA: Diagnosis not present

## 2016-01-06 DIAGNOSIS — Z1231 Encounter for screening mammogram for malignant neoplasm of breast: Secondary | ICD-10-CM | POA: Diagnosis not present

## 2016-01-08 DIAGNOSIS — M5136 Other intervertebral disc degeneration, lumbar region: Secondary | ICD-10-CM | POA: Diagnosis not present

## 2016-01-08 DIAGNOSIS — S39012A Strain of muscle, fascia and tendon of lower back, initial encounter: Secondary | ICD-10-CM | POA: Diagnosis not present

## 2016-01-08 DIAGNOSIS — Z6830 Body mass index (BMI) 30.0-30.9, adult: Secondary | ICD-10-CM | POA: Diagnosis not present

## 2016-01-19 DIAGNOSIS — D499 Neoplasm of unspecified behavior of unspecified site: Secondary | ICD-10-CM | POA: Diagnosis not present

## 2016-01-19 DIAGNOSIS — D518 Other vitamin B12 deficiency anemias: Secondary | ICD-10-CM | POA: Diagnosis not present

## 2016-01-19 DIAGNOSIS — Z170 Estrogen receptor positive status [ER+]: Secondary | ICD-10-CM | POA: Diagnosis not present

## 2016-01-19 DIAGNOSIS — Z853 Personal history of malignant neoplasm of breast: Secondary | ICD-10-CM | POA: Diagnosis not present

## 2016-01-19 DIAGNOSIS — C50412 Malignant neoplasm of upper-outer quadrant of left female breast: Secondary | ICD-10-CM

## 2016-01-19 HISTORY — DX: Malignant neoplasm of upper-outer quadrant of left female breast: C50.412

## 2016-02-04 DIAGNOSIS — Z853 Personal history of malignant neoplasm of breast: Secondary | ICD-10-CM | POA: Diagnosis not present

## 2016-02-04 DIAGNOSIS — C50919 Malignant neoplasm of unspecified site of unspecified female breast: Secondary | ICD-10-CM | POA: Diagnosis not present

## 2016-02-04 DIAGNOSIS — Z79811 Long term (current) use of aromatase inhibitors: Secondary | ICD-10-CM | POA: Diagnosis not present

## 2016-03-24 DIAGNOSIS — N183 Chronic kidney disease, stage 3 (moderate): Secondary | ICD-10-CM | POA: Diagnosis not present

## 2016-03-24 DIAGNOSIS — Z1389 Encounter for screening for other disorder: Secondary | ICD-10-CM | POA: Diagnosis not present

## 2016-03-24 DIAGNOSIS — D510 Vitamin B12 deficiency anemia due to intrinsic factor deficiency: Secondary | ICD-10-CM | POA: Diagnosis not present

## 2016-03-24 DIAGNOSIS — K219 Gastro-esophageal reflux disease without esophagitis: Secondary | ICD-10-CM | POA: Diagnosis not present

## 2016-03-24 DIAGNOSIS — Z6829 Body mass index (BMI) 29.0-29.9, adult: Secondary | ICD-10-CM | POA: Diagnosis not present

## 2016-03-24 DIAGNOSIS — E785 Hyperlipidemia, unspecified: Secondary | ICD-10-CM | POA: Diagnosis not present

## 2016-03-24 DIAGNOSIS — I10 Essential (primary) hypertension: Secondary | ICD-10-CM | POA: Diagnosis not present

## 2016-03-24 DIAGNOSIS — Z9181 History of falling: Secondary | ICD-10-CM | POA: Diagnosis not present

## 2016-03-24 DIAGNOSIS — I5032 Chronic diastolic (congestive) heart failure: Secondary | ICD-10-CM | POA: Diagnosis not present

## 2016-03-24 DIAGNOSIS — D638 Anemia in other chronic diseases classified elsewhere: Secondary | ICD-10-CM | POA: Diagnosis not present

## 2016-06-28 DIAGNOSIS — I10 Essential (primary) hypertension: Secondary | ICD-10-CM | POA: Diagnosis not present

## 2016-06-28 DIAGNOSIS — I5032 Chronic diastolic (congestive) heart failure: Secondary | ICD-10-CM | POA: Diagnosis not present

## 2016-06-28 DIAGNOSIS — D510 Vitamin B12 deficiency anemia due to intrinsic factor deficiency: Secondary | ICD-10-CM | POA: Diagnosis not present

## 2016-06-28 DIAGNOSIS — D638 Anemia in other chronic diseases classified elsewhere: Secondary | ICD-10-CM | POA: Diagnosis not present

## 2016-06-28 DIAGNOSIS — E785 Hyperlipidemia, unspecified: Secondary | ICD-10-CM | POA: Diagnosis not present

## 2016-06-28 DIAGNOSIS — N183 Chronic kidney disease, stage 3 (moderate): Secondary | ICD-10-CM | POA: Diagnosis not present

## 2016-06-28 DIAGNOSIS — Z23 Encounter for immunization: Secondary | ICD-10-CM | POA: Diagnosis not present

## 2016-09-27 DIAGNOSIS — E785 Hyperlipidemia, unspecified: Secondary | ICD-10-CM | POA: Diagnosis not present

## 2016-09-27 DIAGNOSIS — I5032 Chronic diastolic (congestive) heart failure: Secondary | ICD-10-CM | POA: Diagnosis not present

## 2016-09-27 DIAGNOSIS — D519 Vitamin B12 deficiency anemia, unspecified: Secondary | ICD-10-CM | POA: Diagnosis not present

## 2016-09-27 DIAGNOSIS — D638 Anemia in other chronic diseases classified elsewhere: Secondary | ICD-10-CM | POA: Diagnosis not present

## 2016-09-27 DIAGNOSIS — N183 Chronic kidney disease, stage 3 (moderate): Secondary | ICD-10-CM | POA: Diagnosis not present

## 2016-09-27 DIAGNOSIS — Z6828 Body mass index (BMI) 28.0-28.9, adult: Secondary | ICD-10-CM | POA: Diagnosis not present

## 2016-09-27 DIAGNOSIS — I129 Hypertensive chronic kidney disease with stage 1 through stage 4 chronic kidney disease, or unspecified chronic kidney disease: Secondary | ICD-10-CM | POA: Diagnosis not present

## 2016-10-05 DIAGNOSIS — I5032 Chronic diastolic (congestive) heart failure: Secondary | ICD-10-CM | POA: Diagnosis not present

## 2016-10-05 DIAGNOSIS — N189 Chronic kidney disease, unspecified: Secondary | ICD-10-CM | POA: Diagnosis not present

## 2016-10-05 DIAGNOSIS — R61 Generalized hyperhidrosis: Secondary | ICD-10-CM | POA: Diagnosis not present

## 2016-10-05 DIAGNOSIS — I130 Hypertensive heart and chronic kidney disease with heart failure and stage 1 through stage 4 chronic kidney disease, or unspecified chronic kidney disease: Secondary | ICD-10-CM | POA: Diagnosis not present

## 2016-10-05 DIAGNOSIS — E785 Hyperlipidemia, unspecified: Secondary | ICD-10-CM | POA: Diagnosis not present

## 2016-10-05 DIAGNOSIS — I471 Supraventricular tachycardia: Secondary | ICD-10-CM | POA: Diagnosis not present

## 2016-10-12 DIAGNOSIS — I5032 Chronic diastolic (congestive) heart failure: Secondary | ICD-10-CM | POA: Diagnosis not present

## 2016-10-12 DIAGNOSIS — R61 Generalized hyperhidrosis: Secondary | ICD-10-CM | POA: Diagnosis not present

## 2016-10-14 DIAGNOSIS — I471 Supraventricular tachycardia: Secondary | ICD-10-CM | POA: Diagnosis not present

## 2016-10-19 DIAGNOSIS — R001 Bradycardia, unspecified: Secondary | ICD-10-CM | POA: Diagnosis not present

## 2016-11-03 DIAGNOSIS — I6529 Occlusion and stenosis of unspecified carotid artery: Secondary | ICD-10-CM | POA: Diagnosis not present

## 2016-11-03 DIAGNOSIS — I471 Supraventricular tachycardia: Secondary | ICD-10-CM | POA: Diagnosis not present

## 2016-11-03 DIAGNOSIS — I5032 Chronic diastolic (congestive) heart failure: Secondary | ICD-10-CM | POA: Diagnosis not present

## 2016-11-03 DIAGNOSIS — I130 Hypertensive heart and chronic kidney disease with heart failure and stage 1 through stage 4 chronic kidney disease, or unspecified chronic kidney disease: Secondary | ICD-10-CM | POA: Diagnosis not present

## 2016-11-10 DIAGNOSIS — I6529 Occlusion and stenosis of unspecified carotid artery: Secondary | ICD-10-CM | POA: Diagnosis not present

## 2016-11-10 DIAGNOSIS — I471 Supraventricular tachycardia: Secondary | ICD-10-CM | POA: Diagnosis not present

## 2016-11-11 ENCOUNTER — Other Ambulatory Visit: Payer: Self-pay | Admitting: Cardiology

## 2016-11-11 DIAGNOSIS — I6523 Occlusion and stenosis of bilateral carotid arteries: Secondary | ICD-10-CM

## 2016-11-16 ENCOUNTER — Ambulatory Visit
Admission: RE | Admit: 2016-11-16 | Discharge: 2016-11-16 | Disposition: A | Discharge status: Home / Self Care | Payer: PPO | Source: Ambulatory Visit | Attending: Cardiology | Admitting: Cardiology

## 2016-11-16 DIAGNOSIS — I6523 Occlusion and stenosis of bilateral carotid arteries: Secondary | ICD-10-CM | POA: Diagnosis not present

## 2016-11-16 MED ORDER — IOPAMIDOL (ISOVUE-370) INJECTION 76%
60.0000 mL | Freq: Once | INTRAVENOUS | Status: AC | PRN
  Administered 2016-11-16: 60 mL via INTRAVENOUS

## 2017-01-03 DIAGNOSIS — D519 Vitamin B12 deficiency anemia, unspecified: Secondary | ICD-10-CM | POA: Diagnosis not present

## 2017-01-03 DIAGNOSIS — E785 Hyperlipidemia, unspecified: Secondary | ICD-10-CM | POA: Diagnosis not present

## 2017-01-03 DIAGNOSIS — I5032 Chronic diastolic (congestive) heart failure: Secondary | ICD-10-CM | POA: Diagnosis not present

## 2017-01-03 DIAGNOSIS — D638 Anemia in other chronic diseases classified elsewhere: Secondary | ICD-10-CM | POA: Diagnosis not present

## 2017-01-03 DIAGNOSIS — I471 Supraventricular tachycardia: Secondary | ICD-10-CM | POA: Diagnosis not present

## 2017-01-03 DIAGNOSIS — N183 Chronic kidney disease, stage 3 (moderate): Secondary | ICD-10-CM | POA: Diagnosis not present

## 2017-01-03 DIAGNOSIS — I129 Hypertensive chronic kidney disease with stage 1 through stage 4 chronic kidney disease, or unspecified chronic kidney disease: Secondary | ICD-10-CM | POA: Diagnosis not present

## 2017-01-03 DIAGNOSIS — F419 Anxiety disorder, unspecified: Secondary | ICD-10-CM | POA: Diagnosis not present

## 2017-01-12 DIAGNOSIS — M8589 Other specified disorders of bone density and structure, multiple sites: Secondary | ICD-10-CM | POA: Diagnosis not present

## 2017-01-12 DIAGNOSIS — Z1231 Encounter for screening mammogram for malignant neoplasm of breast: Secondary | ICD-10-CM | POA: Diagnosis not present

## 2017-01-12 DIAGNOSIS — M85832 Other specified disorders of bone density and structure, left forearm: Secondary | ICD-10-CM | POA: Diagnosis not present

## 2017-01-18 DIAGNOSIS — Z170 Estrogen receptor positive status [ER+]: Secondary | ICD-10-CM | POA: Diagnosis not present

## 2017-01-18 DIAGNOSIS — C50412 Malignant neoplasm of upper-outer quadrant of left female breast: Secondary | ICD-10-CM | POA: Diagnosis not present

## 2017-02-03 DIAGNOSIS — D518 Other vitamin B12 deficiency anemias: Secondary | ICD-10-CM | POA: Diagnosis not present

## 2017-02-03 DIAGNOSIS — Z853 Personal history of malignant neoplasm of breast: Secondary | ICD-10-CM

## 2017-02-03 DIAGNOSIS — M8580 Other specified disorders of bone density and structure, unspecified site: Secondary | ICD-10-CM

## 2017-03-17 DIAGNOSIS — J019 Acute sinusitis, unspecified: Secondary | ICD-10-CM | POA: Diagnosis not present

## 2017-03-17 DIAGNOSIS — J029 Acute pharyngitis, unspecified: Secondary | ICD-10-CM | POA: Diagnosis not present

## 2017-03-17 DIAGNOSIS — Z6829 Body mass index (BMI) 29.0-29.9, adult: Secondary | ICD-10-CM | POA: Diagnosis not present

## 2017-03-17 DIAGNOSIS — F419 Anxiety disorder, unspecified: Secondary | ICD-10-CM | POA: Diagnosis not present

## 2017-04-14 DIAGNOSIS — I129 Hypertensive chronic kidney disease with stage 1 through stage 4 chronic kidney disease, or unspecified chronic kidney disease: Secondary | ICD-10-CM | POA: Diagnosis not present

## 2017-04-14 DIAGNOSIS — D519 Vitamin B12 deficiency anemia, unspecified: Secondary | ICD-10-CM | POA: Diagnosis not present

## 2017-04-14 DIAGNOSIS — N183 Chronic kidney disease, stage 3 (moderate): Secondary | ICD-10-CM | POA: Diagnosis not present

## 2017-04-14 DIAGNOSIS — M62830 Muscle spasm of back: Secondary | ICD-10-CM | POA: Diagnosis not present

## 2017-04-14 DIAGNOSIS — I5032 Chronic diastolic (congestive) heart failure: Secondary | ICD-10-CM | POA: Diagnosis not present

## 2017-04-14 DIAGNOSIS — I471 Supraventricular tachycardia: Secondary | ICD-10-CM | POA: Diagnosis not present

## 2017-04-14 DIAGNOSIS — E785 Hyperlipidemia, unspecified: Secondary | ICD-10-CM | POA: Diagnosis not present

## 2017-04-14 DIAGNOSIS — Z6829 Body mass index (BMI) 29.0-29.9, adult: Secondary | ICD-10-CM | POA: Diagnosis not present

## 2017-04-14 DIAGNOSIS — D638 Anemia in other chronic diseases classified elsewhere: Secondary | ICD-10-CM | POA: Diagnosis not present

## 2017-04-15 ENCOUNTER — Telehealth: Payer: Self-pay

## 2017-04-15 NOTE — Telephone Encounter (Signed)
Left message to return call. Received lab work from PCP. Need to confirm with patient that she will be following Dr. Bettina Gavia in [facility]. Has not been seen here yet and no appointment scheduled.

## 2017-05-12 DIAGNOSIS — Z6831 Body mass index (BMI) 31.0-31.9, adult: Secondary | ICD-10-CM | POA: Diagnosis not present

## 2017-05-12 DIAGNOSIS — Z136 Encounter for screening for cardiovascular disorders: Secondary | ICD-10-CM | POA: Diagnosis not present

## 2017-05-12 DIAGNOSIS — Z1389 Encounter for screening for other disorder: Secondary | ICD-10-CM | POA: Diagnosis not present

## 2017-05-12 DIAGNOSIS — Z9181 History of falling: Secondary | ICD-10-CM | POA: Diagnosis not present

## 2017-05-12 DIAGNOSIS — E785 Hyperlipidemia, unspecified: Secondary | ICD-10-CM | POA: Diagnosis not present

## 2017-05-12 DIAGNOSIS — Z23 Encounter for immunization: Secondary | ICD-10-CM | POA: Diagnosis not present

## 2017-05-12 DIAGNOSIS — E669 Obesity, unspecified: Secondary | ICD-10-CM | POA: Diagnosis not present

## 2017-05-12 DIAGNOSIS — Z0000 Encounter for general adult medical examination without abnormal findings: Secondary | ICD-10-CM | POA: Diagnosis not present

## 2017-05-28 DIAGNOSIS — J069 Acute upper respiratory infection, unspecified: Secondary | ICD-10-CM | POA: Diagnosis not present

## 2017-06-17 DIAGNOSIS — Z6832 Body mass index (BMI) 32.0-32.9, adult: Secondary | ICD-10-CM | POA: Diagnosis not present

## 2017-06-17 DIAGNOSIS — H1032 Unspecified acute conjunctivitis, left eye: Secondary | ICD-10-CM | POA: Diagnosis not present

## 2017-06-23 DIAGNOSIS — D519 Vitamin B12 deficiency anemia, unspecified: Secondary | ICD-10-CM | POA: Diagnosis not present

## 2017-06-23 DIAGNOSIS — J208 Acute bronchitis due to other specified organisms: Secondary | ICD-10-CM | POA: Diagnosis not present

## 2017-07-25 DIAGNOSIS — F419 Anxiety disorder, unspecified: Secondary | ICD-10-CM | POA: Diagnosis not present

## 2017-07-25 DIAGNOSIS — I471 Supraventricular tachycardia: Secondary | ICD-10-CM | POA: Diagnosis not present

## 2017-07-25 DIAGNOSIS — D519 Vitamin B12 deficiency anemia, unspecified: Secondary | ICD-10-CM | POA: Diagnosis not present

## 2017-07-25 DIAGNOSIS — Z6832 Body mass index (BMI) 32.0-32.9, adult: Secondary | ICD-10-CM | POA: Diagnosis not present

## 2017-07-25 DIAGNOSIS — D638 Anemia in other chronic diseases classified elsewhere: Secondary | ICD-10-CM | POA: Diagnosis not present

## 2017-07-25 DIAGNOSIS — Z139 Encounter for screening, unspecified: Secondary | ICD-10-CM | POA: Diagnosis not present

## 2017-07-25 DIAGNOSIS — Z1331 Encounter for screening for depression: Secondary | ICD-10-CM | POA: Diagnosis not present

## 2017-07-25 DIAGNOSIS — E785 Hyperlipidemia, unspecified: Secondary | ICD-10-CM | POA: Diagnosis not present

## 2017-07-25 DIAGNOSIS — I5032 Chronic diastolic (congestive) heart failure: Secondary | ICD-10-CM | POA: Diagnosis not present

## 2017-07-25 DIAGNOSIS — N183 Chronic kidney disease, stage 3 (moderate): Secondary | ICD-10-CM | POA: Diagnosis not present

## 2017-07-25 DIAGNOSIS — I129 Hypertensive chronic kidney disease with stage 1 through stage 4 chronic kidney disease, or unspecified chronic kidney disease: Secondary | ICD-10-CM | POA: Diagnosis not present

## 2017-08-26 DIAGNOSIS — D519 Vitamin B12 deficiency anemia, unspecified: Secondary | ICD-10-CM | POA: Diagnosis not present

## 2017-09-27 DIAGNOSIS — J180 Bronchopneumonia, unspecified organism: Secondary | ICD-10-CM | POA: Diagnosis not present

## 2017-09-27 DIAGNOSIS — R6889 Other general symptoms and signs: Secondary | ICD-10-CM | POA: Diagnosis not present

## 2017-09-27 DIAGNOSIS — D519 Vitamin B12 deficiency anemia, unspecified: Secondary | ICD-10-CM | POA: Diagnosis not present

## 2017-10-03 DIAGNOSIS — J180 Bronchopneumonia, unspecified organism: Secondary | ICD-10-CM | POA: Diagnosis not present

## 2017-10-28 DIAGNOSIS — Z6834 Body mass index (BMI) 34.0-34.9, adult: Secondary | ICD-10-CM | POA: Diagnosis not present

## 2017-10-28 DIAGNOSIS — N183 Chronic kidney disease, stage 3 (moderate): Secondary | ICD-10-CM | POA: Diagnosis not present

## 2017-10-28 DIAGNOSIS — I471 Supraventricular tachycardia: Secondary | ICD-10-CM | POA: Diagnosis not present

## 2017-10-28 DIAGNOSIS — E785 Hyperlipidemia, unspecified: Secondary | ICD-10-CM | POA: Diagnosis not present

## 2017-10-28 DIAGNOSIS — F419 Anxiety disorder, unspecified: Secondary | ICD-10-CM | POA: Diagnosis not present

## 2017-10-28 DIAGNOSIS — I129 Hypertensive chronic kidney disease with stage 1 through stage 4 chronic kidney disease, or unspecified chronic kidney disease: Secondary | ICD-10-CM | POA: Diagnosis not present

## 2017-10-28 DIAGNOSIS — D519 Vitamin B12 deficiency anemia, unspecified: Secondary | ICD-10-CM | POA: Diagnosis not present

## 2017-10-28 DIAGNOSIS — I5032 Chronic diastolic (congestive) heart failure: Secondary | ICD-10-CM | POA: Diagnosis not present

## 2017-10-28 DIAGNOSIS — D638 Anemia in other chronic diseases classified elsewhere: Secondary | ICD-10-CM | POA: Diagnosis not present

## 2017-11-28 DIAGNOSIS — D519 Vitamin B12 deficiency anemia, unspecified: Secondary | ICD-10-CM | POA: Diagnosis not present

## 2017-11-30 DIAGNOSIS — R0600 Dyspnea, unspecified: Secondary | ICD-10-CM | POA: Diagnosis not present

## 2017-11-30 DIAGNOSIS — I5033 Acute on chronic diastolic (congestive) heart failure: Secondary | ICD-10-CM | POA: Diagnosis not present

## 2017-12-12 NOTE — Progress Notes (Signed)
Cardiology Office Note:    Date:  12/14/2017   ID:  Melanie Montes, DOB 1935-09-06, MRN [id number]  PCP:  Nicoletta Dress, MD  Cardiologist:  Shirlee More, MD    Referring MD: Nicoletta Dress, MD    ASSESSMENT:    1. Persistent atrial fibrillation ([place])   2. Chronic diastolic heart failure ([place])   3. Hypertensive heart and chronic kidney disease with heart failure and stage 1 through stage 4 chronic kidney disease, or unspecified chronic kidney disease ([place])   4. Hyperlipidemia, unspecified hyperlipidemia type    PLAN:    In order of problems listed above:  1. She will discontinue aspirin initiate Eliquis 5 mg twice daily and Holter monitor for heart rate control 2. Stable compensatoted continue her current diuretic 3. Stable continue current antihypertensives, recent creatinine 11/30/2017 1.4 4. Poorly controlled on over-the-counter red yeast rice LDL recently 142 5.    Next appointment: 6 months   Medication Adjustments/Labs and Tests Ordered: Current medicines are reviewed at length with the patient today.  Concerns regarding medicines are outlined above.  Orders Placed This Encounter  Procedures  . HOLTER MONITOR - 48 HOUR  . EKG 12-Lead   Meds ordered this encounter  Medications  . apixaban (ELIQUIS) 5 MG TABS tablet    Sig: Take 1 tablet (5 mg total) by mouth 2 (two) times daily.    Dispense:  60 tablet    Refill:  3    Chief Complaint  Patient presents with  . Follow-up    PAT  . Congestive Heart Failure  . Hypertension  . Chronic Kidney Disease    History of Present Illness:    Melanie Montes is a 82 y.o. female with a hx of CHF, Dyslipidemia, HTN , carotid stenosis and SVT  last seen 10/05/16.  Diagnoses and all orders for this visit: Chronic diastolic heart failure ([facility]) stable compensated continue current diuretic - Ambulatory referral to Cardiology - ECG 12 lead; Future - NM Myocardial Perfusion Spect Multiple;  Future  Hypertensive heart and chronic kidney disease with heart failure and stage 1 through stage 4 chronic kidney disease, or unspecified chronic kidney disease stable blood pressure target  Hyperlipidemia, unspecified hyperlipidemia type ([facility]CC) stable continue her low intensity over-the-counter statin  Chronic kidney disease, unspecified CKD stage stable managed by her PCP  Diaphoresis - NM Myocardial Perfusion Spect Multiple; Future  PAT (paroxysmal atrial tachycardia) ([facility]) symptomatic plan as outlined in her history. - Holter monitor - 48 hour; Future - diltiazem (CARDIZEM CD) 240 MG 24 hr capsule; Take 1 capsule (240 mg total) by mouth daily.  Compliance with diet, lifestyle and medications: Yes No complaints of chest pain shortness of breath orthopnea or edema.  She is more aware of her heart being irregular and has had episodes of lightheadedness on EKG she is found to be in rate controlled atrial fibrillation Past Medical History:  Diagnosis Date  . Abnormal CT scan, chest 05/02/2012   Followed in Pulmonary clinic/ [facility]/ [place]    - CT Chest 04/17/12:  Bilateral clustered nodules both upper lobes and sup segment of lower lobes     . Anemia, pernicious   . Breast cancer ([place])    Left  . Breast cancer of upper-outer quadrant of left female breast ([place]) 01/19/2016  . Carotid artery occlusion without infarction   . Chest pain in adult 06/30/2015   Overview:  Overview:  Stress echo negative for ischemia at 7 mets July 2016 Overview:  Stress echo negative for ischemia at 7 mets July 2016  . Chronic diastolic heart failure ([place]) 06/30/2015  . CKD (chronic kidney disease) 06/30/2015  . Colonic polyp   . COPD (chronic obstructive pulmonary disease) ([place]) 06/02/2012   Followed in Pulmonary clinic/ [facility]/ [place]    - PFT's 06/02/2012 FEV1  1.72 (86%) ratio 68 with marked air trapping  And DLCO 67%   . Cough 05/02/2012   Followed in Pulmonary clinic/ [facility]/ [place]    . Degeneration of lumbar or lumbosacral intervertebral disc   . Edema   . GERD (gastroesophageal reflux disease)   . HTN (hypertension), benign   . Hyperlipidemia   . Hypertensive heart and chronic kidney disease with heart failure and stage 1 through stage 4 chronic kidney disease, or unspecified chronic kidney disease ([place]) 06/30/2015  . Restless legs     Past Surgical History:  Procedure Laterality Date  . ABDOMINAL HYSTERECTOMY  1970  . BREAST MASS EXCISION  2009   left//malignant  . SPINE SURGERY  1980   lumbar    Current Medications: Current Meds  Medication Sig  . Albuterol Sulfate (PROAIR RESPICLICK) 417 (90 Base) MCG/ACT AEPB Inhale 2 puffs into the lungs 4 (four) times daily as needed.  . ALPRAZolam (XANAX) 0.25 MG tablet Take 0.25 mg by mouth 3 (three) times daily as needed for anxiety.  Marland Kitchen anastrozole (ARIMIDEX) 1 MG tablet Take 1 mg by mouth daily.  Marland Kitchen aspirin 81 MG tablet Take 81 mg by mouth daily.  . Azelastine HCl 137 MCG/SPRAY SOLN Place 2 sprays into the nose 2 (two) times daily.  . colesevelam (WELCHOL) 625 MG tablet Take 1,875 mg by mouth 2 (two) times daily with a meal.  . Cyanocobalamin (VITAMIN B 12 PO) Take 1 tablet by mouth daily.  Marland Kitchen diltiazem (DILACOR XR) 240 MG 24 hr capsule Take 240 mg by mouth daily.  [name] Calcium (STOOL SOFTENER PO) Take by mouth.  . furosemide (LASIX) 20 MG tablet Take 20 mg by mouth 3 (three) times daily. 1-2 daily prn based on weight   . Glucosamine-Chondroit-Vit C-Mn (GLUCOSAMINE 1500 COMPLEX PO) Take 2 tablets by mouth daily.  Marland Kitchen guaiFENesin (MUCINEX) 600 MG 12 hr tablet Take 1,200 mg by mouth daily as needed for cough.  . losartan (COZAAR) 100 MG tablet Take 100 mg by mouth daily.  . metoprolol (LOPRESSOR) 100 MG tablet Take 100 mg by mouth 2 (two) times daily.  . niacin 500 MG tablet Take 1,000 mg by mouth daily.  . ondansetron (ZOFRAN) 4 MG tablet Take 4 mg by mouth 4 (four) times daily as needed for  nausea or vomiting.  . potassium chloride (K-DUR,KLOR-CON) 10 MEQ tablet Take 10 mEq by mouth daily.  . ranitidine (ZANTAC) 150 MG tablet Take 300 mg by mouth daily.  . Red Yeast Rice 600 MG TABS Take 1,200 mg by mouth daily.  . vitamin E 1000 UNIT capsule Take 1,000 Units by mouth daily.     Allergies:   Penicillins and Statins   Social History   Socioeconomic History  . Marital status: Married    Spouse name: Not on file  . Number of children: Not on file  . Years of education: Not on file  . Highest education level: Not on file  Occupational History  . Occupation: retired  Scientific laboratory technician  . Financial resource strain: Not on file  . Food insecurity:    Worry: Not on file    Inability: Not on  file  . Transportation needs:    Medical: Not on file    Non-medical: Not on file  Tobacco Use  . Smoking status: Former Smoker    Packs/day: 1.00    Years: 20.00    Pack years: 20.00    Types: Cigarettes    Last attempt to quit: 07/26/2000    Years since quitting: 17.3  . Smokeless tobacco: Never Used  Substance and Sexual Activity  . Alcohol use: No  . Drug use: No  . Sexual activity: Not on file  Lifestyle  . Physical activity:    Days per week: Not on file    Minutes per session: Not on file  . Stress: Not on file  Relationships  . Social connections:    Talks on phone: Not on file    Gets together: Not on file    Attends religious service: Not on file    Active member of club or organization: Not on file    Attends meetings of clubs or organizations: Not on file    Relationship status: Not on file  Other Topics Concern  . Not on file  Social History Narrative  . Not on file     Family History: The patient's family history includes Alzheimer's disease in her mother; Aneurysm in her mother; Colon cancer in her father and paternal aunt; Heart disease in her maternal grandmother and paternal grandmother; Stomach cancer in her paternal grandmother; Stroke in her mother;  Transient ischemic attack in her father. ROS:   Please see the history of present illness.    All other systems reviewed and are negative.  EKGs/Labs/Other Studies Reviewed:    The following studies were reviewed today:  EKG:  EKG ordered today.  The ekg ordered today demonstrates rate controlled atrial fibrillation  Recent Labs: 11/30/17 Cr 1.5 K 3.6, BNP 285 No results found for requested labs within last 8760 hours.  Recent Lipid Panel No results found for: CHOL, TRIG, HDL, CHOLHDL, VLDL, LDLCALC, LDLDIRECT  Physical Exam:    VS:  BP 128/80 (BP Location: Right Arm, Patient Position: Sitting, Cuff Size: Large)   Pulse 77   Ht 5' 4.75" (1.645 m)   Wt 199 lb (90.3 kg)   SpO2 96%   BMI 33.37 kg/m     Wt Readings from Last 3 Encounters:  12/13/17 199 lb (90.3 kg)  01/09/14 195 lb (88.5 kg)  12/25/13 195 lb (88.5 kg)     GEN:  Well nourished, well developed in no acute distress HEENT: Normal NECK: No JVD; No carotid bruits LYMPHATICS: No lymphadenopathy CARDIAC: Irregular irregular variable first heart sound  RESPIRATORY:  Clear to auscultation without rales, wheezing or rhonchi  ABDOMEN: Soft, non-tender, non-distended MUSCULOSKELETAL:  No edema; No deformity  SKIN: Warm and dry NEUROLOGIC:  Alert and oriented x 3 PSYCHIATRIC:  Normal affect    Signed, Shirlee More, MD  12/14/2017 7:52 AM    [place]

## 2017-12-13 ENCOUNTER — Ambulatory Visit (INDEPENDENT_AMBULATORY_CARE_PROVIDER_SITE_OTHER): Payer: PPO | Admitting: Cardiology

## 2017-12-13 ENCOUNTER — Encounter: Payer: Self-pay | Admitting: Cardiology

## 2017-12-13 VITALS — BP 128/80 | HR 77 | Ht 64.75 in | Wt 199.0 lb

## 2017-12-13 DIAGNOSIS — I471 Supraventricular tachycardia: Secondary | ICD-10-CM | POA: Insufficient documentation

## 2017-12-13 DIAGNOSIS — E785 Hyperlipidemia, unspecified: Secondary | ICD-10-CM | POA: Diagnosis not present

## 2017-12-13 DIAGNOSIS — E876 Hypokalemia: Secondary | ICD-10-CM | POA: Diagnosis not present

## 2017-12-13 DIAGNOSIS — I481 Persistent atrial fibrillation: Secondary | ICD-10-CM

## 2017-12-13 DIAGNOSIS — I5032 Chronic diastolic (congestive) heart failure: Secondary | ICD-10-CM

## 2017-12-13 DIAGNOSIS — I130 Hypertensive heart and chronic kidney disease with heart failure and stage 1 through stage 4 chronic kidney disease, or unspecified chronic kidney disease: Secondary | ICD-10-CM

## 2017-12-13 DIAGNOSIS — I4719 Other supraventricular tachycardia: Secondary | ICD-10-CM

## 2017-12-13 DIAGNOSIS — I4819 Other persistent atrial fibrillation: Secondary | ICD-10-CM

## 2017-12-13 HISTORY — DX: Supraventricular tachycardia: I47.1

## 2017-12-13 HISTORY — DX: Other supraventricular tachycardia: I47.19

## 2017-12-13 MED ORDER — APIXABAN 5 MG PO TABS: 5.0000 mg | ORAL_TABLET | Freq: Two times a day (BID) | ORAL | 3 refills | Status: DC

## 2017-12-13 NOTE — Patient Instructions (Addendum)
Medication Instructions:Your physician has recommended you make the following change in your medication:   STOP: Aspirin START: Eliquis 5mg  one tablet BID     Labwork: NONE  Testing/Procedures: Your physician has recommended that you wear a holter monitor. Holter monitors are medical devices that record the heart's electrical activity. Doctors most often use these monitors to diagnose arrhythmias. Arrhythmias are problems with the speed or rhythm of the heartbeat. The monitor is a small, portable device. You can wear one while you do your normal daily activities. This is usually used to diagnose what is causing palpitations/syncope (passing out).  48 HR HM    You had an EKG today  Follow-Up: Your physician wants you to follow-up in: 3 months.   You will receive a reminder letter in the mail two months in advance. If you don't receive a letter, please call our office to schedule the follow-up appointment.   Any Other Special Instructions Will Be Listed Below (If Applicable).     If you need a refill on your cardiac medications before your next appointment, please call your pharmacy.

## 2017-12-21 ENCOUNTER — Ambulatory Visit: Payer: PPO

## 2017-12-21 DIAGNOSIS — I481 Persistent atrial fibrillation: Secondary | ICD-10-CM | POA: Diagnosis not present

## 2017-12-21 DIAGNOSIS — I4819 Other persistent atrial fibrillation: Secondary | ICD-10-CM

## 2017-12-29 DIAGNOSIS — D519 Vitamin B12 deficiency anemia, unspecified: Secondary | ICD-10-CM | POA: Diagnosis not present

## 2018-01-12 ENCOUNTER — Other Ambulatory Visit: Payer: Self-pay

## 2018-01-12 MED ORDER — DILTIAZEM HCL ER 240 MG PO CP24: 240.0000 mg | ORAL_CAPSULE | Freq: Every day | ORAL | 3 refills | Status: DC

## 2018-01-16 DIAGNOSIS — Z1231 Encounter for screening mammogram for malignant neoplasm of breast: Secondary | ICD-10-CM | POA: Diagnosis not present

## 2018-01-23 ENCOUNTER — Telehealth: Payer: Self-pay | Admitting: Cardiology

## 2018-01-23 DIAGNOSIS — N6012 Diffuse cystic mastopathy of left breast: Secondary | ICD-10-CM

## 2018-01-23 DIAGNOSIS — N6011 Diffuse cystic mastopathy of right breast: Secondary | ICD-10-CM

## 2018-01-23 DIAGNOSIS — Z853 Personal history of malignant neoplasm of breast: Secondary | ICD-10-CM | POA: Diagnosis not present

## 2018-01-23 DIAGNOSIS — Z1231 Encounter for screening mammogram for malignant neoplasm of breast: Secondary | ICD-10-CM | POA: Diagnosis not present

## 2018-01-23 DIAGNOSIS — Z1239 Encounter for other screening for malignant neoplasm of breast: Secondary | ICD-10-CM | POA: Insufficient documentation

## 2018-01-23 HISTORY — DX: Diffuse cystic mastopathy of right breast: N60.11

## 2018-01-23 MED ORDER — DILTIAZEM HCL ER 240 MG PO CP24: 240.0000 mg | ORAL_CAPSULE | Freq: Every day | ORAL | 3 refills | Status: DC

## 2018-01-23 NOTE — Telephone Encounter (Signed)
°*  STAT* If patient is at the pharmacy, call can be transferred to refill team.   1. Which medications need to be refilled? (please list name of each medication and dose if known) Ditalizem R 240 mg  2. Which pharmacy/location (including street and city if local pharmacy) is medication to be sent to? [facility]  3. Do they need a 30 day or 90 day supply? [place]

## 2018-01-23 NOTE — Telephone Encounter (Signed)
Rx sent to pharmacy as requested.

## 2018-01-31 DIAGNOSIS — I129 Hypertensive chronic kidney disease with stage 1 through stage 4 chronic kidney disease, or unspecified chronic kidney disease: Secondary | ICD-10-CM | POA: Diagnosis not present

## 2018-01-31 DIAGNOSIS — N183 Chronic kidney disease, stage 3 (moderate): Secondary | ICD-10-CM | POA: Diagnosis not present

## 2018-01-31 DIAGNOSIS — D638 Anemia in other chronic diseases classified elsewhere: Secondary | ICD-10-CM | POA: Diagnosis not present

## 2018-01-31 DIAGNOSIS — I4891 Unspecified atrial fibrillation: Secondary | ICD-10-CM | POA: Diagnosis not present

## 2018-01-31 DIAGNOSIS — I5032 Chronic diastolic (congestive) heart failure: Secondary | ICD-10-CM | POA: Diagnosis not present

## 2018-01-31 DIAGNOSIS — D519 Vitamin B12 deficiency anemia, unspecified: Secondary | ICD-10-CM | POA: Diagnosis not present

## 2018-01-31 DIAGNOSIS — E785 Hyperlipidemia, unspecified: Secondary | ICD-10-CM | POA: Diagnosis not present

## 2018-01-31 DIAGNOSIS — Z6834 Body mass index (BMI) 34.0-34.9, adult: Secondary | ICD-10-CM | POA: Diagnosis not present

## 2018-02-02 ENCOUNTER — Encounter: Payer: Self-pay | Admitting: Cardiology

## 2018-02-02 ENCOUNTER — Ambulatory Visit (INDEPENDENT_AMBULATORY_CARE_PROVIDER_SITE_OTHER): Payer: PPO | Admitting: Cardiology

## 2018-02-02 VITALS — BP 126/78 | HR 64 | Ht 64.0 in | Wt 199.8 lb

## 2018-02-02 DIAGNOSIS — I130 Hypertensive heart and chronic kidney disease with heart failure and stage 1 through stage 4 chronic kidney disease, or unspecified chronic kidney disease: Secondary | ICD-10-CM | POA: Diagnosis not present

## 2018-02-02 DIAGNOSIS — I5032 Chronic diastolic (congestive) heart failure: Secondary | ICD-10-CM

## 2018-02-02 DIAGNOSIS — I481 Persistent atrial fibrillation: Secondary | ICD-10-CM

## 2018-02-02 DIAGNOSIS — I4819 Other persistent atrial fibrillation: Secondary | ICD-10-CM

## 2018-02-02 MED ORDER — TORSEMIDE 20 MG PO TABS: 20.0000 mg | ORAL_TABLET | Freq: Two times a day (BID) | ORAL | 3 refills | Status: DC

## 2018-02-02 NOTE — Progress Notes (Signed)
Cardiology Office Note:    Date:  02/02/2018   ID:  Melanie Montes, DOB Feb 04, 1936, MRN [id number]  PCP:  Nicoletta Dress, MD  Cardiologist:  Shirlee More, MD    Referring MD: Nicoletta Dress, MD    ASSESSMENT:    1. Chronic diastolic heart failure ([place])   2. Hypertensive heart and chronic kidney disease with heart failure and stage 1 through stage 4 chronic kidney disease, or unspecified chronic kidney disease (HCC)   3. Persistent atrial fibrillation (HCC)    PLAN:    In order of problems listed above:  1. Decompensated she is fluid overload we will switch to torsemide higher dose recheck renal function 715 BMP and proBNP in fails to improve require hospitalization IV diuretic 2. Recheck next week at risk for progressive renal insufficiency 3. Stable at this time I would not advise cardioversion I did ask her to stop aspirin which she is taking with her anticoagulant and continue her current rate slowing medication of diltiazem and metoprolol   Next appointment: 2 weeks   Medication Adjustments/Labs and Tests Ordered: Current medicines are reviewed at length with the patient today.  Concerns regarding medicines are outlined above.  Orders Placed This Encounter  Procedures  . Basic Metabolic Panel (BMET)  . Pro b natriuretic peptide (BNP)  . EKG 12-Lead  . ECHOCARDIOGRAM COMPLETE   Meds ordered this encounter  Medications  . torsemide (DEMADEX) 20 MG tablet    Sig: Take 1 tablet (20 mg total) by mouth 2 (two) times daily. Tae an extra tablet in the morning for the first week = 40 mg    Dispense:  180 tablet    Refill:  3    Chief Complaint  Patient presents with  . Congestive Heart Failure  . Atrial Fibrillation    History of Present Illness:    Melanie Montes is a 82 y.o. female with a hx of CHF, Dyslipidemia, HTN , carotid stenosis CKD and SVT/PAT  last seen 12/13/17 with persistent AF and anticoagulated.  Holter monitor performed 12/21/2017 showed  atrial fibrillation with good heart rate control and frequent PVCs. Compliance with diet, lifestyle and medications: Yes  She is developed increasing edema exertional shortness of breath shortness of breath bending over and orthopnea.  She has had weeping from the lower extremities and unfortunately though she restrict sodium does not weigh herself at home she is not taking furosemide 60 mg daily.  Today she is found to be in decompensated heart failure  Past Medical History:  Diagnosis Date  . Abnormal CT scan, chest 05/02/2012   Followed in Pulmonary clinic/ [facility]/ [place]    - CT Chest 04/17/12:  Bilateral clustered nodules both upper lobes and sup segment of lower lobes     . Anemia, pernicious   . Breast cancer ([place])    Left  . Breast cancer of upper-outer quadrant of left female breast ([place]) 01/19/2016  . Carotid artery occlusion without infarction   . Chest pain in adult 06/30/2015   Overview:  Overview:  Stress echo negative for ischemia at 7 mets July 2016 Overview:  Stress echo negative for ischemia at 7 mets July 2016  . Chronic diastolic heart failure ([place]) 06/30/2015  . CKD (chronic kidney disease) 06/30/2015  . Colonic polyp   . COPD (chronic obstructive pulmonary disease) ([place]) 06/02/2012   Followed in Pulmonary clinic/ [facility]/ [place]    - PFT's 06/02/2012 FEV1  1.72 (86%) ratio 68 with marked air  trapping  And DLCO 67%   . Cough 05/02/2012   Followed in Pulmonary clinic/ [facility]/ [place]    . Degeneration of lumbar or lumbosacral intervertebral disc   . Edema   . GERD (gastroesophageal reflux disease)   . HTN (hypertension), benign   . Hyperlipidemia   . Hypertensive heart and chronic kidney disease with heart failure and stage 1 through stage 4 chronic kidney disease, or unspecified chronic kidney disease ([place]) 06/30/2015  . Restless legs     Past Surgical History:  Procedure Laterality Date  . ABDOMINAL HYSTERECTOMY  1970  . BREAST MASS EXCISION   2009   left//malignant  . SPINE SURGERY  1980   lumbar    Current Medications: Current Meds  Medication Sig  . Albuterol Sulfate (PROAIR RESPICLICK) 259 (90 Base) MCG/ACT AEPB Inhale 2 puffs into the lungs 4 (four) times daily as needed.  . ALPRAZolam (XANAX) 0.25 MG tablet Take 0.25 mg by mouth 3 (three) times daily as needed for anxiety.  Marland Kitchen anastrozole (ARIMIDEX) 1 MG tablet Take 1 mg by mouth daily.  Marland Kitchen apixaban (ELIQUIS) 5 MG TABS tablet Take 1 tablet (5 mg total) by mouth 2 (two) times daily.  . Azelastine HCl 137 MCG/SPRAY SOLN Place 2 sprays into the nose 2 (two) times daily.  . colesevelam (WELCHOL) 625 MG tablet Take 1,875 mg by mouth 2 (two) times daily with a meal.  . Cyanocobalamin (VITAMIN B 12 PO) Take 1 tablet by mouth daily.  Marland Kitchen diltiazem (DILACOR XR) 240 MG 24 hr capsule Take 1 capsule (240 mg total) by mouth daily.  [name] Calcium (STOOL SOFTENER PO) Take by mouth.  . Glucosamine-Chondroit-Vit C-Mn (GLUCOSAMINE 1500 COMPLEX PO) Take 2 tablets by mouth daily.  Marland Kitchen guaiFENesin (MUCINEX) 600 MG 12 hr tablet Take 1,200 mg by mouth daily as needed for cough.  . losartan (COZAAR) 100 MG tablet Take 100 mg by mouth daily.  . metoprolol (LOPRESSOR) 100 MG tablet Take 100 mg by mouth 2 (two) times daily.  . niacin 500 MG tablet Take 1,000 mg by mouth daily.  . Omega-3 Fatty Acids (FISH OIL) 1000 MG CAPS Take 2,000 mg by mouth daily.  . potassium chloride (K-DUR,KLOR-CON) 10 MEQ tablet Take 10 mEq by mouth daily.  . ranitidine (ZANTAC) 150 MG tablet Take 300 mg by mouth daily.  . Red Yeast Rice 600 MG TABS Take 1,200 mg by mouth daily.  . vitamin E 1000 UNIT capsule Take 1,000 Units by mouth daily.  . [DISCONTINUED] aspirin 81 MG tablet Take 81 mg by mouth daily.  . [DISCONTINUED] furosemide (LASIX) 20 MG tablet Take 20 mg by mouth 3 (three) times daily. 1-2 daily prn based on weight      Allergies:   Penicillins and Statins   Social History   Socioeconomic History  .  Marital status: Married    Spouse name: Not on file  . Number of children: Not on file  . Years of education: Not on file  . Highest education level: Not on file  Occupational History  . Occupation: retired  Scientific laboratory technician  . Financial resource strain: Not on file  . Food insecurity:    Worry: Not on file    Inability: Not on file  . Transportation needs:    Medical: Not on file    Non-medical: Not on file  Tobacco Use  . Smoking status: Former Smoker    Packs/day: 1.00    Years: 20.00    Pack years: 20.00  Types: Cigarettes    Last attempt to quit: 07/26/2000    Years since quitting: 17.5  . Smokeless tobacco: Never Used  Substance and Sexual Activity  . Alcohol use: No  . Drug use: No  . Sexual activity: Not on file  Lifestyle  . Physical activity:    Days per week: Not on file    Minutes per session: Not on file  . Stress: Not on file  Relationships  . Social connections:    Talks on phone: Not on file    Gets together: Not on file    Attends religious service: Not on file    Active member of club or organization: Not on file    Attends meetings of clubs or organizations: Not on file    Relationship status: Not on file  Other Topics Concern  . Not on file  Social History Narrative  . Not on file     Family History: The patient's family history includes Alzheimer's disease in her mother; Aneurysm in her mother; Colon cancer in her father and paternal aunt; Heart disease in her maternal grandmother and paternal grandmother; Stomach cancer in her paternal grandmother; Stroke in her mother; Transient ischemic attack in her father. ROS:   Please see the history of present illness.    All other systems reviewed and are negative.  EKGs/Labs/Other Studies Reviewed:    The following studies were reviewed today:  EKG:  EKG ordered today.  Atrial fibrillation controlled rate Recent labs requested from her PCP Recent Labs: No results found for requested labs within  last 8760 hours.  Recent Lipid Panel No results found for: CHOL, TRIG, HDL, CHOLHDL, VLDL, LDLCALC, LDLDIRECT  Physical Exam:    VS:  BP 126/78 (BP Location: Right Arm, Patient Position: Sitting, Cuff Size: Normal)   Pulse 64   Ht 5\' 4"  (1.626 m)   Wt 199 lb 12.8 oz (90.6 kg)   SpO2 96%   BMI 34.30 kg/m     Wt Readings from Last 3 Encounters:  02/02/18 199 lb 12.8 oz (90.6 kg)  12/13/17 199 lb (90.3 kg)  01/09/14 195 lb (88.5 kg)     GEN:  Well nourished, well developed in no acute distress HEENT: Normal NECK: No JVD; No carotid bruits LYMPHATICS: No lymphadenopathy CARDIAC: Irr Irr variable s 1RRR, no murmurs, rubs, gallops RESPIRATORY:  Clear to auscultation without rales, wheezing or rhonchi  ABDOMEN: Soft, non-tender, non-distended MUSCULOSKELETAL:  4+ edema to the thighs edema; No deformity  SKIN: Warm and dry NEUROLOGIC:  Alert and oriented x 3 PSYCHIATRIC:  Normal affect    Signed, Shirlee More, MD  02/02/2018 11:14 AM    [place]

## 2018-02-02 NOTE — Patient Instructions (Signed)
Medication Instructions:  Your physician has recommended you make the following change in your medication:  STOP lasix START torsemide 20 mg twice daily; for the first week only take an extra 20 mg in the morning.  Labwork: Your physician recommends that you have the following labs drawn: Please return to our office on 07/15 for BMP and BNP no appointment is necessary  Testing/Procedures: None  Follow-Up: Your physician recommends that you schedule a follow-up appointment in: 2 weeks  Any Other Special Instructions Will Be Listed Below (If Applicable).     If you need a refill on your cardiac medications before your next appointment, please call your pharmacy.   [place], RN, BSN

## 2018-02-03 DIAGNOSIS — Z6833 Body mass index (BMI) 33.0-33.9, adult: Secondary | ICD-10-CM | POA: Diagnosis not present

## 2018-02-03 DIAGNOSIS — S91012A Laceration without foreign body, left ankle, initial encounter: Secondary | ICD-10-CM | POA: Diagnosis not present

## 2018-02-08 ENCOUNTER — Other Ambulatory Visit: Payer: Self-pay

## 2018-02-08 DIAGNOSIS — I5032 Chronic diastolic (congestive) heart failure: Secondary | ICD-10-CM | POA: Diagnosis not present

## 2018-02-08 LAB — BASIC METABOLIC PANEL
BUN/Creatinine Ratio: 26 (ref 12–28)
BUN: 62 mg/dL — AB (ref 8–27)
CALCIUM: 8.8 mg/dL (ref 8.7–10.3)
CO2: 25 mmol/L (ref 20–29)
Chloride: 94 mmol/L — ABNORMAL LOW (ref 96–106)
Creatinine, Ser: 2.35 mg/dL — ABNORMAL HIGH (ref 0.57–1.00)
GFR calc Af Amer: 22 mL/min/{1.73_m2} — ABNORMAL LOW (ref >59)
GFR calc non Af Amer: 19 mL/min/{1.73_m2} — ABNORMAL LOW (ref >59)
GLUCOSE: 117 mg/dL — AB (ref 65–99)
POTASSIUM: 3.4 mmol/L — AB (ref 3.5–5.2)
Sodium: 140 mmol/L (ref 134–144)

## 2018-02-08 LAB — PRO B NATRIURETIC PEPTIDE: NT-Pro BNP: 5486 pg/mL — ABNORMAL HIGH (ref 0–738)

## 2018-02-09 ENCOUNTER — Telehealth: Payer: Self-pay

## 2018-02-09 DIAGNOSIS — I5032 Chronic diastolic (congestive) heart failure: Secondary | ICD-10-CM

## 2018-02-09 DIAGNOSIS — E785 Hyperlipidemia, unspecified: Secondary | ICD-10-CM

## 2018-02-09 DIAGNOSIS — I4819 Other persistent atrial fibrillation: Secondary | ICD-10-CM

## 2018-02-09 DIAGNOSIS — I130 Hypertensive heart and chronic kidney disease with heart failure and stage 1 through stage 4 chronic kidney disease, or unspecified chronic kidney disease: Secondary | ICD-10-CM

## 2018-02-09 NOTE — Telephone Encounter (Signed)
Left message for patient to return call for lab results. 

## 2018-02-16 DIAGNOSIS — I130 Hypertensive heart and chronic kidney disease with heart failure and stage 1 through stage 4 chronic kidney disease, or unspecified chronic kidney disease: Secondary | ICD-10-CM | POA: Diagnosis not present

## 2018-02-16 DIAGNOSIS — I481 Persistent atrial fibrillation: Secondary | ICD-10-CM | POA: Diagnosis not present

## 2018-02-16 DIAGNOSIS — E785 Hyperlipidemia, unspecified: Secondary | ICD-10-CM | POA: Diagnosis not present

## 2018-02-16 DIAGNOSIS — Z853 Personal history of malignant neoplasm of breast: Secondary | ICD-10-CM | POA: Diagnosis not present

## 2018-02-16 DIAGNOSIS — I5032 Chronic diastolic (congestive) heart failure: Secondary | ICD-10-CM | POA: Diagnosis not present

## 2018-02-16 DIAGNOSIS — D518 Other vitamin B12 deficiency anemias: Secondary | ICD-10-CM | POA: Diagnosis not present

## 2018-02-16 NOTE — Telephone Encounter (Signed)
Spoke with patient and advised that she would need repeat lab work per Dr Bettina Gavia.  Patient will come to office today or tomorrow for repeat labs.  Copy faxed to Dr Delena Bali.    Patient verbalized understanding.

## 2018-02-17 LAB — BASIC METABOLIC PANEL
BUN/Creatinine Ratio: 28 (ref 12–28)
BUN: 63 mg/dL — AB (ref 8–27)
CALCIUM: 9 mg/dL (ref 8.7–10.3)
CO2: 28 mmol/L (ref 20–29)
Chloride: 95 mmol/L — ABNORMAL LOW (ref 96–106)
Creatinine, Ser: 2.25 mg/dL — ABNORMAL HIGH (ref 0.57–1.00)
GFR calc non Af Amer: 20 mL/min/{1.73_m2} — ABNORMAL LOW (ref >59)
GFR, EST AFRICAN AMERICAN: 23 mL/min/{1.73_m2} — AB (ref >59)
Glucose: 105 mg/dL — ABNORMAL HIGH (ref 65–99)
POTASSIUM: 4.2 mmol/L (ref 3.5–5.2)
Sodium: 141 mmol/L (ref 134–144)

## 2018-02-17 LAB — PRO B NATRIURETIC PEPTIDE: NT-PRO BNP: 7090 pg/mL — AB (ref 0–738)

## 2018-02-28 ENCOUNTER — Other Ambulatory Visit: Payer: Self-pay

## 2018-02-28 ENCOUNTER — Ambulatory Visit (INDEPENDENT_AMBULATORY_CARE_PROVIDER_SITE_OTHER): Payer: PPO

## 2018-02-28 DIAGNOSIS — I5032 Chronic diastolic (congestive) heart failure: Secondary | ICD-10-CM | POA: Diagnosis not present

## 2018-02-28 NOTE — Progress Notes (Signed)
  Echocardiogram 2D Echocardiogram has been performed.  Darlina Sicilian M  11/28/2017 10:51 AM

## 2018-03-01 ENCOUNTER — Ambulatory Visit (INDEPENDENT_AMBULATORY_CARE_PROVIDER_SITE_OTHER): Payer: PPO | Admitting: Cardiology

## 2018-03-01 ENCOUNTER — Encounter: Payer: Self-pay | Admitting: Cardiology

## 2018-03-01 VITALS — BP 130/86 | HR 87 | Ht 64.0 in | Wt 197.0 lb

## 2018-03-01 DIAGNOSIS — I130 Hypertensive heart and chronic kidney disease with heart failure and stage 1 through stage 4 chronic kidney disease, or unspecified chronic kidney disease: Secondary | ICD-10-CM

## 2018-03-01 DIAGNOSIS — I481 Persistent atrial fibrillation: Secondary | ICD-10-CM

## 2018-03-01 DIAGNOSIS — I471 Supraventricular tachycardia: Secondary | ICD-10-CM | POA: Diagnosis not present

## 2018-03-01 DIAGNOSIS — I5032 Chronic diastolic (congestive) heart failure: Secondary | ICD-10-CM | POA: Diagnosis not present

## 2018-03-01 DIAGNOSIS — S90426A Blister (nonthermal), unspecified lesser toe(s), initial encounter: Secondary | ICD-10-CM | POA: Diagnosis not present

## 2018-03-01 DIAGNOSIS — Z6833 Body mass index (BMI) 33.0-33.9, adult: Secondary | ICD-10-CM | POA: Diagnosis not present

## 2018-03-01 DIAGNOSIS — I4819 Other persistent atrial fibrillation: Secondary | ICD-10-CM

## 2018-03-01 NOTE — Progress Notes (Signed)
Cardiology Office Note:    Date:  03/02/2018   ID:  Melanie Montes, DOB 1936/03/10, MRN [id number]  PCP:  Nicoletta Dress, MD  Cardiologist:  Shirlee More, MD    Referring MD: Nicoletta Dress, MD    ASSESSMENT:    1. Persistent atrial fibrillation ([place])   2. Chronic diastolic heart failure ([place])   3. Hypertensive heart and chronic kidney disease with heart failure and stage 1 through stage 4 chronic kidney disease, or unspecified chronic kidney disease ([place])   4. PAT (paroxysmal atrial tachycardia) (HCC)    PLAN:    In order of problems listed above:  1. She has a background history of paroxysmal atrial tachycardia and since May has been in persistent atrial fibrillation with good heart rate control.  Despite this she is very symptomatic with markedly reduced exercise tolerance exertional shortness of breath despite diuresis with severe CKD and control of her congestive heart failure.  She will undergo cardioversion to attempt to resume sinus rhythm if successful I will place her on amiodarone to maintain sinus rhythm if unsuccessful refer to my EP colleagues for consideration of EP ablation.  She has been on anticoagulants since May compliant and has missed no doses and understands to take her anticoagulant up to and after cardioversion.  At the office visit the options benefits and risks were detailed and she consented. 2. She is quite symptomatic New York Heart Association class II-III despite having no volume overload with her diuretic.  I suspect she will improve if we can resume sinus rhythm 3. Stable hypertension controlled continue current therapy she has stage IV CKD and she will continue her current diuretic therapy. 4. Previously had a background history of atrial tachycardia and she has been in atrial fibrillation since May 2019   Next appointment: One month   Medication Adjustments/Labs and Tests Ordered: Current medicines are reviewed at length with the patient  today.  Concerns regarding medicines are outlined above.  Orders Placed This Encounter  Procedures  . CBC  . Basic metabolic panel  . EKG 12-Lead   No orders of the defined types were placed in this encounter.   Chief Complaint  Patient presents with  . Follow-up    echo  . Congestive Heart Failure  . Hypertension  . Chronic Kidney Disease    History of Present Illness:    Melanie Montes is a 82 y.o. female with a hx of HF, Dyslipidemia, HTN , carotid stenosis CKD and SVT/PAT  persistent AF and anticoagulated  CHF, Dyslipidemia, HTN , carotid stenosis and SVT last seen 02/02/18 with decompensated heart failure. Compliance with diet, lifestyle and medications: Yes  Her weights are stable she sodium restricts her heart failure is compensated and she remains very symptomatic with market exercise intolerance and exertional shortness of breath.  She has had no chest pain bleeding complication of her anticoagulant TIA syncope edema orthopnea.  Because of her ongoing symptoms attempt will be made to resume sinus rhythm she has been on anticoagulants since May compliant and has not missed dose will continue to take it up to and after cardioversion.  If successful with heart failure I will place her on amiodarone to maintain sinus rhythm and if unsuccessful refer to my EP colleagues for consideration of ablation for persistent atrial fibrillation with heart failure. Past Medical History:  Diagnosis Date  . Abnormal CT scan, chest 05/02/2012   Followed in Pulmonary clinic/ [facility]/ [place]    - CT  Chest 04/17/12:  Bilateral clustered nodules both upper lobes and sup segment of lower lobes     . Anemia, pernicious   . Breast cancer ([place])    Left  . Breast cancer of upper-outer quadrant of left female breast ([place]) 01/19/2016  . Carotid artery occlusion without infarction   . Chest pain in adult 06/30/2015   Overview:  Overview:  Stress echo negative for ischemia at 7 mets July 2016  Overview:  Stress echo negative for ischemia at 7 mets July 2016  . Chronic diastolic heart failure ([place]) 06/30/2015  . CKD (chronic kidney disease) 06/30/2015  . Colonic polyp   . COPD (chronic obstructive pulmonary disease) ([place]) 06/02/2012   Followed in Pulmonary clinic/ [facility]/ [place]    - PFT's 06/02/2012 FEV1  1.72 (86%) ratio 68 with marked air trapping  And DLCO 67%   . Cough 05/02/2012   Followed in Pulmonary clinic/ [facility]/ [place]    . Degeneration of lumbar or lumbosacral intervertebral disc   . Edema   . GERD (gastroesophageal reflux disease)   . HTN (hypertension), benign   . Hyperlipidemia   . Hypertensive heart and chronic kidney disease with heart failure and stage 1 through stage 4 chronic kidney disease, or unspecified chronic kidney disease ([place]) 06/30/2015  . Restless legs     Past Surgical History:  Procedure Laterality Date  . ABDOMINAL HYSTERECTOMY  1970  . BREAST MASS EXCISION  2009   left//malignant  . SPINE SURGERY  1980   lumbar    Current Medications: Current Meds  Medication Sig  . Albuterol Sulfate (PROAIR RESPICLICK) 540 (90 Base) MCG/ACT AEPB Inhale 2 puffs into the lungs 4 (four) times daily as needed.  . ALPRAZolam (XANAX) 0.25 MG tablet Take 0.25 mg by mouth 3 (three) times daily as needed for anxiety.  Marland Kitchen anastrozole (ARIMIDEX) 1 MG tablet Take 1 mg by mouth daily.  Marland Kitchen apixaban (ELIQUIS) 5 MG TABS tablet Take 1 tablet (5 mg total) by mouth 2 (two) times daily.  . Azelastine HCl 137 MCG/SPRAY SOLN Place 2 sprays into the nose 2 (two) times daily.  . colesevelam (WELCHOL) 625 MG tablet Take 1,875 mg by mouth 2 (two) times daily with a meal.  . Cyanocobalamin (VITAMIN B 12 PO) Take 1 tablet by mouth daily.  [name] Calcium (STOOL SOFTENER PO) Take by mouth.  . Glucosamine-Chondroit-Vit C-Mn (GLUCOSAMINE 1500 COMPLEX PO) Take 2 tablets by mouth daily.  Marland Kitchen guaiFENesin (MUCINEX) 600 MG 12 hr tablet Take 1,200 mg by mouth daily as  needed for cough.  . losartan (COZAAR) 100 MG tablet Take 100 mg by mouth daily.  . metoprolol (LOPRESSOR) 100 MG tablet Take 100 mg by mouth 2 (two) times daily.  . niacin 500 MG tablet Take 1,000 mg by mouth daily.  . Omega-3 Fatty Acids (FISH OIL) 1000 MG CAPS Take 2,000 mg by mouth daily.  . potassium chloride (K-DUR,KLOR-CON) 10 MEQ tablet Take 10 mEq by mouth daily.  . ranitidine (ZANTAC) 150 MG tablet Take 300 mg by mouth daily.  . Red Yeast Rice 600 MG TABS Take 1,200 mg by mouth daily.  Marland Kitchen torsemide (DEMADEX) 20 MG tablet Take 1 tablet (20 mg total) by mouth 2 (two) times daily. Tae an extra tablet in the morning for the first week = 40 mg  . vitamin E 1000 UNIT capsule Take 1,000 Units by mouth daily.     Allergies:   Penicillins and Statins   Social History  Socioeconomic History  . Marital status: Married    Spouse name: Not on file  . Number of children: Not on file  . Years of education: Not on file  . Highest education level: Not on file  Occupational History  . Occupation: retired  Scientific laboratory technician  . Financial resource strain: Not on file  . Food insecurity:    Worry: Not on file    Inability: Not on file  . Transportation needs:    Medical: Not on file    Non-medical: Not on file  Tobacco Use  . Smoking status: Former Smoker    Packs/day: 1.00    Years: 20.00    Pack years: 20.00    Types: Cigarettes    Last attempt to quit: 07/26/2000    Years since quitting: 17.6  . Smokeless tobacco: Never Used  Substance and Sexual Activity  . Alcohol use: No  . Drug use: No  . Sexual activity: Not on file  Lifestyle  . Physical activity:    Days per week: Not on file    Minutes per session: Not on file  . Stress: Not on file  Relationships  . Social connections:    Talks on phone: Not on file    Gets together: Not on file    Attends religious service: Not on file    Active member of club or organization: Not on file    Attends meetings of clubs or  organizations: Not on file    Relationship status: Not on file  Other Topics Concern  . Not on file  Social History Narrative  . Not on file     Family History: The patient's family history includes Alzheimer's disease in her mother; Aneurysm in her mother; Colon cancer in her father and paternal aunt; Heart disease in her maternal grandmother and paternal grandmother; Stomach cancer in her paternal grandmother; Stroke in her mother; Transient ischemic attack in her father. ROS:   Please see the history of present illness.    All other systems reviewed and are negative.  EKGs/Labs/Other Studies Reviewed:    The following studies were reviewed today:  Echo 02/28/18 EF 60% trivial AR EKG 03/01/2018 shows atrial fibrillation controlled ventricular rates Recent Labs: 02/16/2018: BUN 63; Creatinine, Ser 2.25; NT-Pro BNP 7,090; Potassium 4.2; Sodium 141  Recent Lipid Panel No results found for: CHOL, TRIG, HDL, CHOLHDL, VLDL, LDLCALC, LDLDIRECT  Physical Exam:    VS:  BP 130/86 (BP Location: Right Arm, Patient Position: Sitting, Cuff Size: Normal)   Pulse 87   Ht 5\' 4"  (1.626 m)   Wt 197 lb (89.4 kg)   SpO2 94%   BMI 33.81 kg/m     Wt Readings from Last 3 Encounters:  03/01/18 197 lb (89.4 kg)  02/02/18 199 lb 12.8 oz (90.6 kg)  12/13/17 199 lb (90.3 kg)     GEN:  Well nourished, well developed in no acute distress HEENT: Normal NECK: No JVD; No carotid bruits LYMPHATICS: No lymphadenopathy CARDIAC: Irr Irr variable s1 RRR, no murmurs, rubs, gallops RESPIRATORY:  Clear to auscultation without rales, wheezing or rhonchi  ABDOMEN: Soft, non-tender, non-distended MUSCULOSKELETAL:  No edema; No deformity  SKIN: Warm and dry NEUROLOGIC:  Alert and oriented x 3 PSYCHIATRIC:  Normal affect    Signed, Shirlee More, MD  03/02/2018 8:11 AM    [place]

## 2018-03-01 NOTE — Patient Instructions (Addendum)
Medication Instructions:  Your physician recommends that you continue on your current medications as directed. Please refer to the Current Medication list given to you today.   Labwork: Your physician recommends that you return for lab work today: BMP, and CBC.    Testing/Procedures:  You are scheduled for a Cardioversion on August 15th  with Dr. Aundra Dubin.  Please arrive at the [facility] (Main Entrance A) at [facility]: [address] [place], [place] [address] at 12:30 pm. (1 hour prior to procedure)  DIET: Nothing to eat or drink after midnight except a sip of water with medications (see medication instructions below)  Medication Instructions: Hold torsemide the day of the procedure.   Continue your anticoagulant: eliquis You will need to continue your anticoagulant after your procedure until you  are told by your  Provider that it is safe to stop   Labs: Please have labs drawn 2-3 days before August 15th. (BMP and CBC)     You must have a responsible person to drive you home and stay in the waiting area during your procedure. Failure to do so could result in cancellation.  Bring your insurance cards.  *Special Note: Every effort is made to have your procedure done on time. Occasionally there are emergencies that occur at the hospital that may cause delays. Please be patient if a delay does occur.    Follow-Up:  Your physician recommends that you schedule a follow-up appointment in: 4 weeks.    Any Other Special Instructions Will Be Listed Below (If Applicable).     If you need a refill on your cardiac medications before your next appointment, please call your pharmacy.    Electrical Cardioversion Electrical cardioversion is the delivery of a jolt of electricity to restore a normal rhythm to the heart. A rhythm that is too fast or is not regular keeps the heart from pumping well. In this procedure, sticky patches or metal paddles are placed on the chest to  deliver electricity to the heart from a device. This procedure may be done in an emergency if:  There is low or no blood pressure as a result of the heart rhythm.  Normal rhythm must be restored as fast as possible to protect the brain and heart from further damage.  It may save a life.  This procedure may also be done for irregular or fast heart rhythms that are not immediately life-threatening. Tell a health care provider about:  Any allergies you have.  All medicines you are taking, including vitamins, herbs, eye drops, creams, and over-the-counter medicines.  Any problems you or family members have had with anesthetic medicines.  Any blood disorders you have.  Any surgeries you have had.  Any medical conditions you have.  Whether you are pregnant or may be pregnant. What are the risks? Generally, this is a safe procedure. However, problems may occur, including:  Allergic reactions to medicines.  A blood clot that breaks free and travels to other parts of your body.  The possible return of an abnormal heart rhythm within hours or days after the procedure.  Your heart stopping (cardiac arrest). This is rare.  What happens before the procedure? Medicines  Your health care provider may have you start taking: ? Blood-thinning medicines (anticoagulants) so your blood does not clot as easily. ? Medicines may be given to help stabilize your heart rate and rhythm.  Ask your health care provider about changing or stopping your regular medicines. This is especially important  if you are taking diabetes medicines or blood thinners. General instructions  Plan to have someone take you home from the hospital or clinic.  If you will be going home right after the procedure, plan to have someone with you for 24 hours.  Follow instructions from your health care provider about eating or drinking restrictions. What happens during the procedure?  To lower your risk of  infection: ? Your health care team will wash or sanitize their hands. ? Your skin will be washed with soap.  An IV tube will be inserted into one of your veins.  You will be given a medicine to help you relax (sedative).  Sticky patches (electrodes) or metal paddles may be placed on your chest.  An electrical shock will be delivered. The procedure may vary among health care providers and hospitals. What happens after the procedure?  Your blood pressure, heart rate, breathing rate, and blood oxygen level will be monitored until the medicines you were given have worn off.  Do not drive for 24 hours if you were given a sedative.  Your heart rhythm will be watched to make sure it does not change. This information is not intended to replace advice given to you by your health care provider. Make sure you discuss any questions you have with your health care provider. Document Released: 07/02/2002 Document Revised: 03/10/2016 Document Reviewed: 01/16/2016 Elsevier Interactive Patient Education  2017 [employer].

## 2018-03-01 NOTE — H&P (View-Only) (Signed)
Cardiology Office Note:    Date:  03/02/2018   ID:  Melanie Montes, DOB Sep 05, 1935, MRN [id number]  PCP:  Melanie Dress, MD  Cardiologist:  Melanie More, MD    Referring MD: Melanie Dress, MD    ASSESSMENT:    1. Persistent atrial fibrillation ([place])   2. Chronic diastolic heart failure ([place])   3. Hypertensive heart and chronic kidney disease with heart failure and stage 1 through stage 4 chronic kidney disease, or unspecified chronic kidney disease ([place])   4. PAT (paroxysmal atrial tachycardia) (HCC)    PLAN:    In order of problems listed above:  1. She has a background history of paroxysmal atrial tachycardia and since May has been in persistent atrial fibrillation with good heart rate control.  Despite this she is very symptomatic with markedly reduced exercise tolerance exertional shortness of breath despite diuresis with severe CKD and control of her congestive heart failure.  She will undergo cardioversion to attempt to resume sinus rhythm if successful I will place her on amiodarone to maintain sinus rhythm if unsuccessful refer to my EP colleagues for consideration of EP ablation.  She has been on anticoagulants since May compliant and has missed no doses and understands to take her anticoagulant up to and after cardioversion.  At the office visit the options benefits and risks were detailed and she consented. 2. She is quite symptomatic New York Heart Association class II-III despite having no volume overload with her diuretic.  I suspect she will improve if we can resume sinus rhythm 3. Stable hypertension controlled continue current therapy she has stage IV CKD and she will continue her current diuretic therapy. 4. Previously had a background history of atrial tachycardia and she has been in atrial fibrillation since May 2019   Next appointment: One month   Medication Adjustments/Labs and Tests Ordered: Current medicines are reviewed at length with the patient  today.  Concerns regarding medicines are outlined above.  Orders Placed This Encounter  Procedures  . CBC  . Basic metabolic panel  . EKG 12-Lead   No orders of the defined types were placed in this encounter.   Chief Complaint  Patient presents with  . Follow-up    echo  . Congestive Heart Failure  . Hypertension  . Chronic Kidney Disease    History of Present Illness:    Melanie Montes is a 82 y.o. female with a hx of HF, Dyslipidemia, HTN , carotid stenosis CKD and SVT/PAT  persistent AF and anticoagulated  CHF, Dyslipidemia, HTN , carotid stenosis and SVT last seen 02/02/18 with decompensated heart failure. Compliance with diet, lifestyle and medications: Yes  Her weights are stable she sodium restricts her heart failure is compensated and she remains very symptomatic with market exercise intolerance and exertional shortness of breath.  She has had no chest pain bleeding complication of her anticoagulant TIA syncope edema orthopnea.  Because of her ongoing symptoms attempt will be made to resume sinus rhythm she has been on anticoagulants since May compliant and has not missed dose will continue to take it up to and after cardioversion.  If successful with heart failure I will place her on amiodarone to maintain sinus rhythm and if unsuccessful refer to my EP colleagues for consideration of ablation for persistent atrial fibrillation with heart failure. Past Medical History:  Diagnosis Date  . Abnormal CT scan, chest 05/02/2012   Followed in Pulmonary clinic/ [facility]/ [place]    - CT  Chest 04/17/12:  Bilateral clustered nodules both upper lobes and sup segment of lower lobes     . Anemia, pernicious   . Breast cancer ([place])    Left  . Breast cancer of upper-outer quadrant of left female breast ([place]) 01/19/2016  . Carotid artery occlusion without infarction   . Chest pain in adult 06/30/2015   Overview:  Overview:  Stress echo negative for ischemia at 7 mets July 2016  Overview:  Stress echo negative for ischemia at 7 mets July 2016  . Chronic diastolic heart failure ([place]) 06/30/2015  . CKD (chronic kidney disease) 06/30/2015  . Colonic polyp   . COPD (chronic obstructive pulmonary disease) ([place]) 06/02/2012   Followed in Pulmonary clinic/ [facility]/ [place]    - PFT's 06/02/2012 FEV1  1.72 (86%) ratio 68 with marked air trapping  And DLCO 67%   . Cough 05/02/2012   Followed in Pulmonary clinic/ [facility]/ [place]    . Degeneration of lumbar or lumbosacral intervertebral disc   . Edema   . GERD (gastroesophageal reflux disease)   . HTN (hypertension), benign   . Hyperlipidemia   . Hypertensive heart and chronic kidney disease with heart failure and stage 1 through stage 4 chronic kidney disease, or unspecified chronic kidney disease ([place]) 06/30/2015  . Restless legs     Past Surgical History:  Procedure Laterality Date  . ABDOMINAL HYSTERECTOMY  1970  . BREAST MASS EXCISION  2009   left//malignant  . SPINE SURGERY  1980   lumbar    Current Medications: Current Meds  Medication Sig  . Albuterol Sulfate (PROAIR RESPICLICK) 972 (90 Base) MCG/ACT AEPB Inhale 2 puffs into the lungs 4 (four) times daily as needed.  . ALPRAZolam (XANAX) 0.25 MG tablet Take 0.25 mg by mouth 3 (three) times daily as needed for anxiety.  Melanie Kitchen anastrozole (ARIMIDEX) 1 MG tablet Take 1 mg by mouth daily.  Melanie Kitchen apixaban (ELIQUIS) 5 MG TABS tablet Take 1 tablet (5 mg total) by mouth 2 (two) times daily.  . Azelastine HCl 137 MCG/SPRAY SOLN Place 2 sprays into the nose 2 (two) times daily.  . colesevelam (WELCHOL) 625 MG tablet Take 1,875 mg by mouth 2 (two) times daily with a meal.  . Cyanocobalamin (VITAMIN B 12 PO) Take 1 tablet by mouth daily.  [name] Calcium (STOOL SOFTENER PO) Take by mouth.  . Glucosamine-Chondroit-Vit C-Mn (GLUCOSAMINE 1500 COMPLEX PO) Take 2 tablets by mouth daily.  Melanie Kitchen guaiFENesin (MUCINEX) 600 MG 12 hr tablet Take 1,200 mg by mouth daily as  needed for cough.  . losartan (COZAAR) 100 MG tablet Take 100 mg by mouth daily.  . metoprolol (LOPRESSOR) 100 MG tablet Take 100 mg by mouth 2 (two) times daily.  . niacin 500 MG tablet Take 1,000 mg by mouth daily.  . Omega-3 Fatty Acids (FISH OIL) 1000 MG CAPS Take 2,000 mg by mouth daily.  . potassium chloride (K-DUR,KLOR-CON) 10 MEQ tablet Take 10 mEq by mouth daily.  . ranitidine (ZANTAC) 150 MG tablet Take 300 mg by mouth daily.  . Red Yeast Rice 600 MG TABS Take 1,200 mg by mouth daily.  Melanie Kitchen torsemide (DEMADEX) 20 MG tablet Take 1 tablet (20 mg total) by mouth 2 (two) times daily. Tae an extra tablet in the morning for the first week = 40 mg  . vitamin E 1000 UNIT capsule Take 1,000 Units by mouth daily.     Allergies:   Penicillins and Statins   Social History  Socioeconomic History  . Marital status: Married    Spouse name: Not on file  . Number of children: Not on file  . Years of education: Not on file  . Highest education level: Not on file  Occupational History  . Occupation: retired  Scientific laboratory technician  . Financial resource strain: Not on file  . Food insecurity:    Worry: Not on file    Inability: Not on file  . Transportation needs:    Medical: Not on file    Non-medical: Not on file  Tobacco Use  . Smoking status: Former Smoker    Packs/day: 1.00    Years: 20.00    Pack years: 20.00    Types: Cigarettes    Last attempt to quit: 07/26/2000    Years since quitting: 17.6  . Smokeless tobacco: Never Used  Substance and Sexual Activity  . Alcohol use: No  . Drug use: No  . Sexual activity: Not on file  Lifestyle  . Physical activity:    Days per week: Not on file    Minutes per session: Not on file  . Stress: Not on file  Relationships  . Social connections:    Talks on phone: Not on file    Gets together: Not on file    Attends religious service: Not on file    Active member of club or organization: Not on file    Attends meetings of clubs or  organizations: Not on file    Relationship status: Not on file  Other Topics Concern  . Not on file  Social History Narrative  . Not on file     Family History: The patient's family history includes Alzheimer's disease in her mother; Aneurysm in her mother; Colon cancer in her father and paternal aunt; Heart disease in her maternal grandmother and paternal grandmother; Stomach cancer in her paternal grandmother; Stroke in her mother; Transient ischemic attack in her father. ROS:   Please see the history of present illness.    All other systems reviewed and are negative.  EKGs/Labs/Other Studies Reviewed:    The following studies were reviewed today:  Echo 02/28/18 EF 60% trivial AR EKG 03/01/2018 shows atrial fibrillation controlled ventricular rates Recent Labs: 02/16/2018: BUN 63; Creatinine, Ser 2.25; NT-Pro BNP 7,090; Potassium 4.2; Sodium 141  Recent Lipid Panel No results found for: CHOL, TRIG, HDL, CHOLHDL, VLDL, LDLCALC, LDLDIRECT  Physical Exam:    VS:  BP 130/86 (BP Location: Right Arm, Patient Position: Sitting, Cuff Size: Normal)   Pulse 87   Ht 5\' 4"  (1.626 m)   Wt 197 lb (89.4 kg)   SpO2 94%   BMI 33.81 kg/m     Wt Readings from Last 3 Encounters:  03/01/18 197 lb (89.4 kg)  02/02/18 199 lb 12.8 oz (90.6 kg)  12/13/17 199 lb (90.3 kg)     GEN:  Well nourished, well developed in no acute distress HEENT: Normal NECK: No JVD; No carotid bruits LYMPHATICS: No lymphadenopathy CARDIAC: Irr Irr variable s1 RRR, no murmurs, rubs, gallops RESPIRATORY:  Clear to auscultation without rales, wheezing or rhonchi  ABDOMEN: Soft, non-tender, non-distended MUSCULOSKELETAL:  No edema; No deformity  SKIN: Warm and dry NEUROLOGIC:  Alert and oriented x 3 PSYCHIATRIC:  Normal affect    Signed, Melanie More, MD  03/02/2018 8:11 AM    [place]

## 2018-03-02 ENCOUNTER — Ambulatory Visit: Payer: PPO | Admitting: Cardiology

## 2018-03-06 DIAGNOSIS — I471 Supraventricular tachycardia: Secondary | ICD-10-CM | POA: Diagnosis not present

## 2018-03-06 DIAGNOSIS — I130 Hypertensive heart and chronic kidney disease with heart failure and stage 1 through stage 4 chronic kidney disease, or unspecified chronic kidney disease: Secondary | ICD-10-CM | POA: Diagnosis not present

## 2018-03-06 DIAGNOSIS — I5032 Chronic diastolic (congestive) heart failure: Secondary | ICD-10-CM | POA: Diagnosis not present

## 2018-03-06 LAB — CBC
Hematocrit: 39.4 % (ref 34.0–46.6)
Hemoglobin: 13.8 g/dL (ref 11.1–15.9)
MCH: 31.4 pg (ref 26.6–33.0)
MCHC: 35 g/dL (ref 31.5–35.7)
MCV: 90 fL (ref 79–97)
PLATELETS: 235 10*3/uL (ref 150–450)
RBC: 4.4 x10E6/uL (ref 3.77–5.28)
RDW: 13.5 % (ref 12.3–15.4)
WBC: 6.2 10*3/uL (ref 3.4–10.8)

## 2018-03-06 LAB — BASIC METABOLIC PANEL
BUN / CREAT RATIO: 20 (ref 12–28)
BUN: 41 mg/dL — AB (ref 8–27)
CHLORIDE: 98 mmol/L (ref 96–106)
CO2: 27 mmol/L (ref 20–29)
Calcium: 8.8 mg/dL (ref 8.7–10.3)
Creatinine, Ser: 2.01 mg/dL — ABNORMAL HIGH (ref 0.57–1.00)
GFR calc Af Amer: 26 mL/min/{1.73_m2} — ABNORMAL LOW (ref >59)
GFR calc non Af Amer: 23 mL/min/{1.73_m2} — ABNORMAL LOW (ref >59)
GLUCOSE: 101 mg/dL — AB (ref 65–99)
POTASSIUM: 4 mmol/L (ref 3.5–5.2)
Sodium: 141 mmol/L (ref 134–144)

## 2018-03-09 ENCOUNTER — Ambulatory Visit (HOSPITAL_COMMUNITY): Payer: PPO | Admitting: Anesthesiology

## 2018-03-09 ENCOUNTER — Ambulatory Visit (HOSPITAL_COMMUNITY)
Admission: RE | Admit: 2018-03-09 | Discharge: 2018-03-09 | Disposition: A | Discharge status: Home / Self Care | Payer: PPO | Source: Ambulatory Visit | Attending: Cardiology | Admitting: Cardiology

## 2018-03-09 ENCOUNTER — Encounter (HOSPITAL_COMMUNITY)
Admission: RE | Disposition: A | Discharge status: Home / Self Care | Payer: Self-pay | Source: Ambulatory Visit | Attending: Cardiology

## 2018-03-09 ENCOUNTER — Other Ambulatory Visit: Payer: Self-pay

## 2018-03-09 ENCOUNTER — Encounter (HOSPITAL_COMMUNITY): Payer: Self-pay | Admitting: Anesthesiology

## 2018-03-09 DIAGNOSIS — I6529 Occlusion and stenosis of unspecified carotid artery: Secondary | ICD-10-CM | POA: Diagnosis not present

## 2018-03-09 DIAGNOSIS — K219 Gastro-esophageal reflux disease without esophagitis: Secondary | ICD-10-CM | POA: Insufficient documentation

## 2018-03-09 DIAGNOSIS — I481 Persistent atrial fibrillation: Secondary | ICD-10-CM | POA: Insufficient documentation

## 2018-03-09 DIAGNOSIS — I471 Supraventricular tachycardia: Secondary | ICD-10-CM | POA: Diagnosis not present

## 2018-03-09 DIAGNOSIS — E785 Hyperlipidemia, unspecified: Secondary | ICD-10-CM | POA: Insufficient documentation

## 2018-03-09 DIAGNOSIS — I130 Hypertensive heart and chronic kidney disease with heart failure and stage 1 through stage 4 chronic kidney disease, or unspecified chronic kidney disease: Secondary | ICD-10-CM | POA: Insufficient documentation

## 2018-03-09 DIAGNOSIS — Z87891 Personal history of nicotine dependence: Secondary | ICD-10-CM | POA: Diagnosis not present

## 2018-03-09 DIAGNOSIS — J449 Chronic obstructive pulmonary disease, unspecified: Secondary | ICD-10-CM | POA: Insufficient documentation

## 2018-03-09 DIAGNOSIS — Z7901 Long term (current) use of anticoagulants: Secondary | ICD-10-CM | POA: Diagnosis not present

## 2018-03-09 DIAGNOSIS — G2581 Restless legs syndrome: Secondary | ICD-10-CM | POA: Diagnosis not present

## 2018-03-09 DIAGNOSIS — N184 Chronic kidney disease, stage 4 (severe): Secondary | ICD-10-CM | POA: Diagnosis not present

## 2018-03-09 DIAGNOSIS — I5032 Chronic diastolic (congestive) heart failure: Secondary | ICD-10-CM | POA: Insufficient documentation

## 2018-03-09 DIAGNOSIS — I4891 Unspecified atrial fibrillation: Secondary | ICD-10-CM | POA: Diagnosis not present

## 2018-03-09 HISTORY — PX: CARDIOVERSION: SHX1299

## 2018-03-09 SURGERY — CARDIOVERSION
Anesthesia: General

## 2018-03-09 MED ORDER — LIDOCAINE 2% (20 MG/ML) 5 ML SYRINGE
INTRAMUSCULAR | Status: DC | PRN
  Administered 2018-03-09: 20 mg via INTRAVENOUS

## 2018-03-09 MED ORDER — SODIUM CHLORIDE 0.9 % IV SOLN
INTRAVENOUS | Status: DC | PRN
  Administered 2018-03-09: 13:00:00 via INTRAVENOUS

## 2018-03-09 MED ORDER — PROPOFOL 10 MG/ML IV BOLUS
INTRAVENOUS | Status: DC | PRN
  Administered 2018-03-09: 30 mg via INTRAVENOUS
  Administered 2018-03-09: 20 mg via INTRAVENOUS
  Administered 2018-03-09: 50 mg via INTRAVENOUS

## 2018-03-09 NOTE — Anesthesia Postprocedure Evaluation (Signed)
Anesthesia Post Note  Patient: Melanie Montes  Procedure(s) Performed: CARDIOVERSION (N/A )     Patient location during evaluation: PACU Anesthesia Type: General Level of consciousness: awake and alert Pain management: pain level controlled Vital Signs Assessment: post-procedure vital signs reviewed and stable Respiratory status: spontaneous breathing, nonlabored ventilation, respiratory function stable and patient connected to nasal cannula oxygen Cardiovascular status: blood pressure returned to baseline and stable Postop Assessment: no apparent nausea or vomiting Anesthetic complications: no    Last Vitals:  Vitals:   03/09/18 1320 03/09/18 1334  BP: 126/74 139/70  Pulse: 90   Resp: 18   Temp:    SpO2: 96%     Last Pain:  Vitals:   03/09/18 1334  TempSrc:   PainSc: 0-No pain                 Tiajuana Amass

## 2018-03-09 NOTE — Procedures (Signed)
Electrical Cardioversion Procedure Note Melanie Montes [id number] Jan 18, 1936  Procedure: Electrical Cardioversion Indications:  Atrial Fibrillation  Procedure Details Consent: Risks of procedure as well as the alternatives and risks of each were explained to the (patient/caregiver).  Consent for procedure obtained. Time Out: Verified patient identification, verified procedure, site/side was marked, verified correct patient position, special equipment/implants available, medications/allergies/relevent history reviewed, required imaging and test results available.  Performed  Patient placed on cardiac monitor, pulse oximetry, supplemental oxygen as necessary.  Sedation given: Propofol per anesthesiology Pacer pads placed anterior and posterior chest.  Cardioverted 3 times, sternal pressure used the 2nd two attempts.   Cardioverted at [place].  Evaluation Findings: Post procedure EKG shows: Atrial Fibrillation Complications: None Patient did tolerate procedure well.  I was unable to convert her back to NSR even using sternal pressure.  I suspect that she will need an anti-arrhythmic or consideration of ablation to get her back into NSR.    Loralie Champagne 03/09/2018, 1:07 PM

## 2018-03-09 NOTE — Discharge Instructions (Signed)
Electrical Cardioversion, Care After °This sheet gives you information about how to care for yourself after your procedure. Your health care provider may also give you more specific instructions. If you have problems or questions, contact your health care provider. °What can I expect after the procedure? °After the procedure, it is common to have: °· Some redness on the skin where the shocks were given. ° °Follow these instructions at home: °· Do not drive for 24 hours if you were given a medicine to help you relax (sedative). °· Take over-the-counter and prescription medicines only as told by your health care provider. °· Ask your health care provider how to check your pulse. Check it often. °· Rest for 48 hours after the procedure or as told by your health care provider. °· Avoid or limit your caffeine use as told by your health care provider. °Contact a health care provider if: °· You feel like your heart is beating too quickly or your pulse is not regular. °· You have a serious muscle cramp that does not go away. °Get help right away if: °· You have discomfort in your chest. °· You are dizzy or you feel faint. °· You have trouble breathing or you are short of breath. °· Your speech is slurred. °· You have trouble moving an arm or leg on one side of your body. °· Your fingers or toes turn cold or blue. °This information is not intended to replace advice given to you by your health care provider. Make sure you discuss any questions you have with your health care provider. °Document Released: 05/02/2013 Document Revised: 02/13/2016 Document Reviewed: 01/16/2016 °Elsevier Interactive Patient Education © 2018 [employer] Inc[employer] ° °

## 2018-03-09 NOTE — Transfer of Care (Signed)
Immediate Anesthesia Transfer of Care Note  Patient: Melanie Montes  Procedure(s) Performed: CARDIOVERSION (N/A )  Patient Location: Endoscopy Unit  Anesthesia Type:General  Level of Consciousness: awake, alert , drowsy and patient cooperative  Airway & Oxygen Therapy: Patient Spontanous Breathing and Patient connected to nasal cannula oxygen  Post-op Assessment: Report given to RN and Post -op Vital signs reviewed and stable  Post vital signs: Reviewed and stable  Last Vitals:  Vitals Value Taken Time  BP    Temp    Pulse    Resp    SpO2      Last Pain:  Vitals:   03/09/18 1236  TempSrc: Oral  PainSc: 0-No pain         Complications: No apparent anesthesia complications

## 2018-03-09 NOTE — Anesthesia Preprocedure Evaluation (Signed)
Anesthesia Evaluation  Patient identified by MRN, date of birth, ID band Patient awake    Reviewed: Allergy & Precautions, NPO status , Patient's Chart, lab work & pertinent test results  Airway Mallampati: II  TM Distance: >3 FB Neck ROM: Full    Dental  (+) Dental Advisory Given   Pulmonary COPD, former smoker,    breath sounds clear to auscultation       Cardiovascular hypertension, Pt. on medications and Pt. on home beta blockers + Peripheral Vascular Disease  + dysrhythmias Atrial Fibrillation  Rhythm:Regular Rate:Normal     Neuro/Psych negative neurological ROS     GI/Hepatic Neg liver ROS, GERD  ,  Endo/Other  negative endocrine ROS  Renal/GU CRFRenal disease     Musculoskeletal  (+) Arthritis ,   Abdominal   Peds  Hematology  (+) anemia ,   Anesthesia Other Findings   Reproductive/Obstetrics                             Anesthesia Physical Anesthesia Plan  ASA: III  Anesthesia Plan: General   Post-op Pain Management:    Induction: Intravenous  PONV Risk Score and Plan: 2 and Treatment may vary due to age or medical condition  Airway Management Planned: Mask and Natural Airway  Additional Equipment:   Intra-op Plan:   Post-operative Plan:   Informed Consent: I have reviewed the patients History and Physical, chart, labs and discussed the procedure including the risks, benefits and alternatives for the proposed anesthesia with the patient or authorized representative who has indicated his/her understanding and acceptance.   Dental advisory given  Plan Discussed with: CRNA  Anesthesia Plan Comments:         Anesthesia Quick Evaluation

## 2018-03-09 NOTE — Anesthesia Procedure Notes (Signed)
Procedure Name: General with mask airway Date/Time: 03/09/2018 12:59 PM Performed by: Renato Shin, CRNA Pre-anesthesia Checklist: Patient identified, Emergency Drugs available, Suction available and Patient being monitored Patient Re-evaluated:Patient Re-evaluated prior to induction Oxygen Delivery Method: Ambu bag Preoxygenation: Pre-oxygenation with 100% oxygen Induction Type: IV induction Placement Confirmation: positive ETCO2,  CO2 detector and breath sounds checked- equal and bilateral Dental Injury: Teeth and Oropharynx as per pre-operative assessment

## 2018-03-09 NOTE — Interval H&P Note (Signed)
History and Physical Interval Note:  03/09/2018 12:58 PM  Melanie Montes  has presented today for surgery, with the diagnosis of afib  The various methods of treatment have been discussed with the patient and family. After consideration of risks, benefits and other options for treatment, the patient has consented to  Procedure(s): CARDIOVERSION (N/A) as a surgical intervention .  The patient's history has been reviewed, patient examined, no change in status, stable for surgery.  I have reviewed the patient's chart and labs.  Questions were answered to the patient's satisfaction.     Tandy Grawe [employer]

## 2018-03-12 ENCOUNTER — Encounter (HOSPITAL_COMMUNITY): Payer: Self-pay | Admitting: Cardiology

## 2018-04-03 ENCOUNTER — Ambulatory Visit: Payer: PPO | Admitting: Cardiology

## 2018-04-17 ENCOUNTER — Other Ambulatory Visit: Payer: Self-pay | Admitting: Cardiology

## 2018-05-08 DIAGNOSIS — Z23 Encounter for immunization: Secondary | ICD-10-CM | POA: Diagnosis not present

## 2018-05-08 DIAGNOSIS — D519 Vitamin B12 deficiency anemia, unspecified: Secondary | ICD-10-CM | POA: Diagnosis not present

## 2018-05-08 DIAGNOSIS — I129 Hypertensive chronic kidney disease with stage 1 through stage 4 chronic kidney disease, or unspecified chronic kidney disease: Secondary | ICD-10-CM | POA: Diagnosis not present

## 2018-05-08 DIAGNOSIS — D638 Anemia in other chronic diseases classified elsewhere: Secondary | ICD-10-CM | POA: Diagnosis not present

## 2018-05-08 DIAGNOSIS — I4891 Unspecified atrial fibrillation: Secondary | ICD-10-CM | POA: Diagnosis not present

## 2018-05-08 DIAGNOSIS — Z6834 Body mass index (BMI) 34.0-34.9, adult: Secondary | ICD-10-CM | POA: Diagnosis not present

## 2018-05-08 DIAGNOSIS — E785 Hyperlipidemia, unspecified: Secondary | ICD-10-CM | POA: Diagnosis not present

## 2018-05-08 DIAGNOSIS — N183 Chronic kidney disease, stage 3 (moderate): Secondary | ICD-10-CM | POA: Diagnosis not present

## 2018-05-08 DIAGNOSIS — I5032 Chronic diastolic (congestive) heart failure: Secondary | ICD-10-CM | POA: Diagnosis not present

## 2018-05-16 NOTE — Progress Notes (Signed)
Cardiology Office Note:    Date:  05/17/2018   ID:  Melanie Montes, DOB Oct 10, 1935, MRN [id number]  PCP:  Nicoletta Dress, MD  Cardiologist:  Shirlee More, MD    Referring MD: Nicoletta Dress, MD    ASSESSMENT:    1. Persistent atrial fibrillation   2. Chronic anticoagulation   3. Hypertensive heart and chronic kidney disease with heart failure and stage 1 through stage 4 chronic kidney disease, or unspecified chronic kidney disease ([place])   4. Stenosis of carotid artery, unspecified laterality    PLAN:    In order of problems listed above:  1. She remains in atrial fibrillation has market exercise intolerance understands of persistent atrial fibrillation and is very difficult to resume sinus rhythm we will place her on amiodarone and if atrial fibrillation persist after 6 weeks we will attempt cardioversion again.  She will follow-up in 1 week for an EKG check heart rate and blood pressure daily at home will contact us if she thinks she is back in sinus rhythm sinus rhythm 2. Stable continue her anticoagulant I stressed the need for she is concerned about cost I asked her to discuss with her pharmacist of the #1 the direct agents is less expensive with her plan D Medicare 3. Stable blood pressure at target heart failure is compensated 4. Follow-up carotid CTA is ordered December 5. Hyperlipidemia is not at target very upset about medications cost of medications not interested in second drug at this time will readdress at her next visit if she will accept Zetia along with her over-the-counter red rice yeast   Next appointment: 6 weeks   Medication Adjustments/Labs and Tests Ordered: Current medicines are reviewed at length with the patient today.  Concerns regarding medicines are outlined above.  No orders of the defined types were placed in this encounter.  No orders of the defined types were placed in this encounter.   Chief Complaint  Patient presents with  .  Follow-up    after cardioversion  . Atrial Fibrillation  . Congestive Heart Failure  . Hypertension  . Hyperlipidemia  . Anticoagulation    History of Present Illness:     Melanie Montes is a 82 y.o. female with a hx of HF, Dyslipidemia, HTN , carotid stenosis CKD and SVT/PAT  persistent AF and anticoagulated  CHF, Dyslipidemia, HTN , carotid stenosis and SVT 03/01/18 last seen. An attempt at cardioversion 03/09/18 was unsuccessful. Compliance with diet, lifestyle and medications: Yes  She is very frustrated and disappointed that cardioversion was unsuccessful.  She has market exercise intolerance and struggles even with ADLs with fatigue and shortness of breath her weight is stable she has no edema.  She has had no anginal discomfort she takes her anticoagulant is concerned of cost but has had no bleeding complication.  We discussed the disc difficult issue of resuming sinus rhythm with persistent atrial fibrillation and with heart failure we will place her on amiodarone to see if she spontaneously converts and if not she can repeat cardioversion. Past Medical History:  Diagnosis Date  . Abnormal CT scan, chest 05/02/2012   Followed in Pulmonary clinic/ [facility]/ [place]    - CT Chest 04/17/12:  Bilateral clustered nodules both upper lobes and sup segment of lower lobes     . Anemia, pernicious   . Breast cancer ([place])    Left  . Breast cancer of upper-outer quadrant of left female breast ([place]) 01/19/2016  . Carotid artery occlusion  without infarction   . Chest pain in adult 06/30/2015   Overview:  Overview:  Stress echo negative for ischemia at 7 mets July 2016 Overview:  Stress echo negative for ischemia at 7 mets July 2016  . Chronic diastolic heart failure ([place]) 06/30/2015  . CKD (chronic kidney disease) 06/30/2015  . Colonic polyp   . COPD (chronic obstructive pulmonary disease) ([place]) 06/02/2012   Followed in Pulmonary clinic/ [facility]/ [place]    - PFT's 06/02/2012 FEV1   1.72 (86%) ratio 68 with marked air trapping  And DLCO 67%   . Cough 05/02/2012   Followed in Pulmonary clinic/ [facility]/ [place]    . Degeneration of lumbar or lumbosacral intervertebral disc   . Edema   . GERD (gastroesophageal reflux disease)   . HTN (hypertension), benign   . Hyperlipidemia   . Hypertensive heart and chronic kidney disease with heart failure and stage 1 through stage 4 chronic kidney disease, or unspecified chronic kidney disease ([place]) 06/30/2015  . Restless legs     Past Surgical History:  Procedure Laterality Date  . ABDOMINAL HYSTERECTOMY  1970  . BREAST MASS EXCISION  2009   left//malignant  . CARDIOVERSION N/A 03/09/2018   Procedure: CARDIOVERSION;  Surgeon: Larey Dresser, MD;  Location[facility] ENDOSCOPY;  Service: Cardiovascular;  Laterality: N/A;  . SPINE SURGERY  1980   lumbar    Current Medications: Current Meds  Medication Sig  . Albuterol Sulfate (PROAIR RESPICLICK) 412 (90 Base) MCG/ACT AEPB Inhale 2 puffs into the lungs every 6 (six) hours as needed (shortness of breath).   . ALPRAZolam (XANAX) 0.25 MG tablet Take 0.25 mg by mouth daily as needed for anxiety.   . cyanocobalamin (,VITAMIN B-12,) 1000 MCG/ML injection Inject 1,000 mcg into the muscle every 30 (thirty) days.  Marland Kitchen ELIQUIS 5 MG TABS tablet TAKE 1 TABLET TWICE DAILY.  Marland Kitchen Glucosamine-Chondroit-Vit C-Mn (GLUCOSAMINE 1500 COMPLEX PO) Take 1,500 mg by mouth 2 (two) times daily.   Marland Kitchen losartan (COZAAR) 100 MG tablet Take 100 mg by mouth daily.  . metoprolol (LOPRESSOR) 100 MG tablet Take 100 mg by mouth 2 (two) times daily.  . niacin 500 MG tablet Take 1,000 mg by mouth at bedtime.   . Omega-3 Fatty Acids (FISH OIL) 1200 MG CAPS Take 1,200 mg by mouth 2 (two) times daily.   . potassium chloride (K-DUR,KLOR-CON) 10 MEQ tablet Take 10 mEq by mouth daily.  . ranitidine (ZANTAC) 150 MG tablet Take 150 mg by mouth 2 (two) times daily.   . Red Yeast Rice 600 MG TABS Take 600 mg by mouth 2 (two)  times daily.   Marland Kitchen torsemide (DEMADEX) 20 MG tablet Take 1 tablet (20 mg total) by mouth 2 (two) times daily. Tae an extra tablet in the morning for the first week = 40 mg  . vitamin E 1000 UNIT capsule Take 1,000 Units by mouth daily.     Allergies:   Penicillins and Statins   Social History   Socioeconomic History  . Marital status: Married    Spouse name: Not on file  . Number of children: Not on file  . Years of education: Not on file  . Highest education level: Not on file  Occupational History  . Occupation: retired  Scientific laboratory technician  . Financial resource strain: Not on file  . Food insecurity:    Worry: Not on file    Inability: Not on file  . Transportation needs:    Medical: Not on file  Non-medical: Not on file  Tobacco Use  . Smoking status: Former Smoker    Packs/day: 1.00    Years: 20.00    Pack years: 20.00    Types: Cigarettes    Last attempt to quit: 07/26/2000    Years since quitting: 17.8  . Smokeless tobacco: Never Used  Substance and Sexual Activity  . Alcohol use: No  . Drug use: No  . Sexual activity: Not on file  Lifestyle  . Physical activity:    Days per week: Not on file    Minutes per session: Not on file  . Stress: Not on file  Relationships  . Social connections:    Talks on phone: Not on file    Gets together: Not on file    Attends religious service: Not on file    Active member of club or organization: Not on file    Attends meetings of clubs or organizations: Not on file    Relationship status: Not on file  Other Topics Concern  . Not on file  Social History Narrative  . Not on file     Family History: The patient's family history includes Alzheimer's disease in her mother; Aneurysm in her mother; Colon cancer in her father and paternal aunt; Heart disease in her maternal grandmother and paternal grandmother; Stomach cancer in her paternal grandmother; Stroke in her mother; Transient ischemic attack in her father. ROS:   Please  see the history of present illness.    All other systems reviewed and are negative.  EKGs/Labs/Other Studies Reviewed:    The following studies were reviewed today:  EKG:  EKG ordered today.  The ekg ordered today demonstrates atrial fibrillation controlled rate  Recent Labs: Performed 05/08/2018 cholesterol 204 LDL 134 HDL 39 creatinine 1.83. 02/16/2018: NT-Pro BNP 7,090 03/06/2018: BUN 41; Creatinine, Ser 2.01; Hemoglobin 13.8; Platelets 235; Potassium 4.0; Sodium 141  Recent Lipid Panel No results found for: CHOL, TRIG, HDL, CHOLHDL, VLDL, LDLCALC, LDLDIRECT  Physical Exam:    VS:  BP (!) 142/86 (BP Location: Right Arm, Patient Position: Sitting, Cuff Size: Large)   Pulse 99   Ht 5' 4.5" (1.638 m)   Wt 199 lb 3.2 oz (90.4 kg)   SpO2 96%   BMI 33.66 kg/m     Wt Readings from Last 3 Encounters:  05/17/18 199 lb 3.2 oz (90.4 kg)  03/09/18 196 lb (88.9 kg)  03/01/18 197 lb (89.4 kg)     GEN:  Well nourished, well developed in no acute distress HEENT: Normal NECK: No JVD; No carotid bruits LYMPHATICS: No lymphadenopathy CARDIAC: Irr Irr variable S1  RESPIRATORY:  Clear to auscultation without rales, wheezing or rhonchi  ABDOMEN: Soft, non-tender, non-distended MUSCULOSKELETAL:  No edema; No deformity  SKIN: Warm and dry NEUROLOGIC:  Alert and oriented x 3 PSYCHIATRIC:  Normal affect    Signed, Shirlee More, MD  05/17/2018 10:58 AM    [place]

## 2018-05-17 ENCOUNTER — Encounter: Payer: Self-pay | Admitting: Cardiology

## 2018-05-17 ENCOUNTER — Ambulatory Visit (INDEPENDENT_AMBULATORY_CARE_PROVIDER_SITE_OTHER): Payer: PPO | Admitting: Cardiology

## 2018-05-17 VITALS — BP 142/86 | HR 99 | Ht 64.5 in | Wt 199.2 lb

## 2018-05-17 DIAGNOSIS — Z7901 Long term (current) use of anticoagulants: Secondary | ICD-10-CM | POA: Diagnosis not present

## 2018-05-17 DIAGNOSIS — I4819 Other persistent atrial fibrillation: Secondary | ICD-10-CM | POA: Diagnosis not present

## 2018-05-17 DIAGNOSIS — I130 Hypertensive heart and chronic kidney disease with heart failure and stage 1 through stage 4 chronic kidney disease, or unspecified chronic kidney disease: Secondary | ICD-10-CM | POA: Diagnosis not present

## 2018-05-17 DIAGNOSIS — I6529 Occlusion and stenosis of unspecified carotid artery: Secondary | ICD-10-CM

## 2018-05-17 HISTORY — DX: Occlusion and stenosis of unspecified carotid artery: I65.29

## 2018-05-17 MED ORDER — AMIODARONE HCL 200 MG PO TABS: ORAL_TABLET | ORAL | 0 refills | Status: DC

## 2018-05-17 NOTE — Addendum Note (Signed)
Addended by: Austin Miles on: 05/17/2018 11:10 AM   Modules accepted: Orders

## 2018-05-17 NOTE — Patient Instructions (Addendum)
Medication Instructions:  Your physician has recommended you make the following change in your medication:   START amiodarone (pacerone) 200 mg: Take 1 tablet twice daily for 2 weeks then decrease to 1 tablet once daily  If you need a refill on your cardiac medications before your next appointment, please call your pharmacy.   Lab work: None  If you have labs (blood work) drawn today and your tests are completely normal, you will receive your results only by: Marland Kitchen MyChart Message (if you have MyChart) OR . A paper copy in the mail If you have any lab test that is abnormal or we need to change your treatment, we will call you to review the results.  Testing/Procedures: You had an EKG today.   Your physician has requested that you have a carotid duplex. This test is an ultrasound of the carotid arteries in your neck. It looks at blood flow through these arteries that supply the brain with blood. Allow one hour for this exam. There are no restrictions or special instructions.  Follow-Up: At [facility], you and your health needs are our priority.  As part of our continuing mission to provide you with exceptional heart care, we have created designated Provider Care Teams.  These Care Teams include your primary Cardiologist (physician) and Advanced Practice Providers (APPs -  Physician Assistants and Nurse Practitioners) who all work together to provide you with the care you need, when you need it. . You will need a follow up appointment in 6 weeks.    You will be scheduled for a nurse visit to have an EKG done in 1 week in our [place] office.   Any Other Special Instructions Will Be Listed Below (If Applicable). Please check your heart rate and blood pressure at the same time each day and keep a log of these readings. Bring the log to our office for Dr. Bettina Gavia to review when you return for your EKG in 1 week. Call our office if you feel like you are no longer in atrial fibrillation.      Amiodarone tablets What is this medicine? AMIODARONE (a MEE oh da rone) is an antiarrhythmic drug. It helps make your heart beat regularly. Because of the side effects caused by this medicine, it is only used when other medicines have not worked. It is usually used for heartbeat problems that may be life threatening. This medicine may be used for other purposes; ask your health care provider or pharmacist if you have questions. COMMON BRAND NAME(S): Cordarone, Pacerone What should I tell my health care provider before I take this medicine? They need to know if you have any of these conditions: -liver disease -lung disease -other heart problems -thyroid disease -an unusual or allergic reaction to amiodarone, iodine, other medicines, foods, dyes, or preservatives -pregnant or trying to get pregnant -breast-feeding How should I use this medicine? Take this medicine by mouth with a glass of water. Follow the directions on the prescription label. You can take this medicine with or without food. However, you should always take it the same way each time. Take your doses at regular intervals. Do not take your medicine more often than directed. Do not stop taking except on the advice of your doctor or health care professional. A special MedGuide will be given to you by the pharmacist with each prescription and refill. Be sure to read this information carefully each time. Talk to your pediatrician regarding the use of this medicine in children. Special care  may be needed. Overdosage: If you think you have taken too much of this medicine contact a poison control center or emergency room at once. NOTE: This medicine is only for you. Do not share this medicine with others. What if I miss a dose? If you miss a dose, take it as soon as you can. If it is almost time for your next dose, take only that dose. Do not take double or extra doses. What may interact with this medicine? Do not take this  medicine with any of the following medications: -abarelix -apomorphine -arsenic trioxide -certain antibiotics like erythromycin, gemifloxacin, levofloxacin, pentamidine -certain medicines for depression like amoxapine, tricyclic antidepressants -certain medicines for fungal infections like fluconazole, itraconazole, ketoconazole, posaconazole, voriconazole -certain medicines for irregular heart beat like disopyramide, dofetilide, dronedarone, ibutilide, propafenone, sotalol -certain medicines for malaria like chloroquine, halofantrine -cisapride -droperidol -haloperidol -hawthorn -maprotiline -methadone -phenothiazines like chlorpromazine, mesoridazine, thioridazine -pimozide -ranolazine -red yeast rice -vardenafil -ziprasidone This medicine may also interact with the following medications: -antiviral medicines for HIV or AIDS -certain medicines for blood pressure, heart disease, irregular heart beat -certain medicines for cholesterol like atorvastatin, cerivastatin, lovastatin, simvastatin -certain medicines for hepatitis C like sofosbuvir and ledipasvir; sofosbuvir -certain medicines for seizures like phenytoin -certain medicines for thyroid problems -certain medicines that treat or prevent blood clots like warfarin -cholestyramine -cimetidine -clopidogrel -cyclosporine -dextromethorphan -diuretics -fentanyl -general anesthetics -grapefruit juice -lidocaine -loratadine -methotrexate -other medicines that prolong the QT interval (cause an abnormal heart rhythm) -procainamide -quinidine -rifabutin, rifampin, or rifapentine -St. John's Wort -trazodone This list may not describe all possible interactions. Give your health care provider a list of all the medicines, herbs, non-prescription drugs, or dietary supplements you use. Also tell them if you smoke, drink alcohol, or use illegal drugs. Some items may interact with your medicine. What should I watch for while using  this medicine? Your condition will be monitored closely when you first begin therapy. Often, this drug is first started in a hospital or other monitored health care setting. Once you are on maintenance therapy, visit your doctor or health care professional for regular checks on your progress. Because your condition and use of this medicine carry some risk, it is a good idea to carry an identification card, necklace or bracelet with details of your condition, medications, and doctor or health care professional. Dennis Bast may get drowsy or dizzy. Do not drive, use machinery, or do anything that needs mental alertness until you know how this medicine affects you. Do not stand or sit up quickly, especially if you are an older patient. This reduces the risk of dizzy or fainting spells. This medicine can make you more sensitive to the sun. Keep out of the sun. If you cannot avoid being in the sun, wear protective clothing and use sunscreen. Do not use sun lamps or tanning beds/booths. You should have regular eye exams before and during treatment. Call your doctor if you have blurred vision, see halos, or your eyes become sensitive to light. Your eyes may get dry. It may be helpful to use a lubricating eye solution or artificial tears solution. If you are going to have surgery or a procedure that requires contrast dyes, tell your doctor or health care professional that you are taking this medicine. What side effects may I notice from receiving this medicine? Side effects that you should report to your doctor or health care professional as soon as possible: -allergic reactions like skin rash, itching or hives, swelling of the face, lips,  or tongue -blue-gray coloring of the skin -blurred vision, seeing blue green halos, increased sensitivity of the eyes to light -breathing problems -chest pain -dark urine -fast, irregular heartbeat -feeling faint or light-headed -intolerance to heat or cold -nausea or  vomiting -pain and swelling of the scrotum -pain, tingling, numbness in feet, hands -redness, blistering, peeling or loosening of the skin, including inside the mouth -spitting up blood -stomach pain -sweating -unusual or uncontrolled movements of body -unusually weak or tired -weight gain or loss -yellowing of the eyes or skin Side effects that usually do not require medical attention (report to your doctor or health care professional if they continue or are bothersome): -change in sex drive or performance -constipation -dizziness -headache -loss of appetite -trouble sleeping This list may not describe all possible side effects. Call your doctor for medical advice about side effects. You may report side effects to FDA at 1-800-FDA-1088. Where should I keep my medicine? Keep out of the reach of children. Store at room temperature between 20 and 25 degrees C (68 and 77 degrees F). Protect from light. Keep container tightly closed. Throw away any unused medicine after the expiration date. NOTE: This sheet is a summary. It may not cover all possible information. If you have questions about this medicine, talk to your doctor, pharmacist, or health care provider.  2018 Elsevier/Gold Standard (2013-10-15 19:48:11)

## 2018-05-24 ENCOUNTER — Ambulatory Visit (INDEPENDENT_AMBULATORY_CARE_PROVIDER_SITE_OTHER): Payer: PPO | Admitting: Cardiology

## 2018-05-24 VITALS — BP 132/78 | HR 95 | Resp 16 | Ht 64.0 in | Wt 201.2 lb

## 2018-05-24 DIAGNOSIS — I471 Supraventricular tachycardia: Secondary | ICD-10-CM | POA: Diagnosis not present

## 2018-05-24 NOTE — Patient Instructions (Signed)
Medication Instructions:  Your physician recommends that you continue on your current medications as directed. Please refer to the Current Medication list given to you today.  If you need a refill on your cardiac medications before your next appointment, please call your pharmacy.   Lab work: None.  If you have labs (blood work) drawn today and your tests are completely normal, you will receive your results only by: Marland Kitchen MyChart Message (if you have MyChart) OR . A paper copy in the mail If you have any lab test that is abnormal or we need to change your treatment, we will call you to review the results.  Testing/Procedures: None.    Follow-Up: . Follow up as previously advised   Any Other Special Instructions Will Be Listed Below (If Applicable).

## 2018-05-25 NOTE — Progress Notes (Signed)
Patient here to have ekg done after starting amiodarone 200 mg daily for atrial fibrillation. Patient ekg reviewed by Dr. Agustin Cree she was advised to continue this medication and follow up with Dr. Bettina Gavia as previously advised. Patient verbally understands

## 2018-06-06 ENCOUNTER — Other Ambulatory Visit: Payer: Self-pay | Admitting: Cardiology

## 2018-06-08 DIAGNOSIS — D519 Vitamin B12 deficiency anemia, unspecified: Secondary | ICD-10-CM | POA: Diagnosis not present

## 2018-06-08 DIAGNOSIS — J019 Acute sinusitis, unspecified: Secondary | ICD-10-CM | POA: Diagnosis not present

## 2018-06-08 DIAGNOSIS — J208 Acute bronchitis due to other specified organisms: Secondary | ICD-10-CM | POA: Diagnosis not present

## 2018-06-30 ENCOUNTER — Ambulatory Visit: Payer: PPO | Admitting: Cardiology

## 2018-07-04 ENCOUNTER — Ambulatory Visit (INDEPENDENT_AMBULATORY_CARE_PROVIDER_SITE_OTHER): Payer: PPO

## 2018-07-04 DIAGNOSIS — I6529 Occlusion and stenosis of unspecified carotid artery: Secondary | ICD-10-CM | POA: Diagnosis not present

## 2018-07-04 NOTE — Progress Notes (Signed)
Complete carotid duplex exam has been performed. Bilateral carotid stenosis and severe calcific plaque was observed.  Melanie Montes RDCS, RVT

## 2018-07-10 DIAGNOSIS — D519 Vitamin B12 deficiency anemia, unspecified: Secondary | ICD-10-CM | POA: Diagnosis not present

## 2018-07-10 NOTE — Progress Notes (Signed)
Cardiology Office Note:    Date:  07/12/2018   ID:  Melanie Montes, DOB September 19, 1935, MRN [id number]  PCP:  Nicoletta Dress, MD  Cardiologist:  Shirlee More, MD    Referring MD: Nicoletta Dress, MD    ASSESSMENT:    1. Persistent atrial fibrillation   2. On amiodarone therapy   3. Hypertensive heart and chronic kidney disease with heart failure and stage 1 through stage 4 chronic kidney disease, or unspecified chronic kidney disease ([place])   4. Chronic obstructive pulmonary disease, unspecified COPD type ([place])    PLAN:    In order of problems listed above:  1. She continues in atrial fibrillation on amiodarone.  She has a marked reduction in her functional abilities and before excepting persistent atrial fibrillation or chronic we will try 1 more attempt at cardioversion as she has been on amiodarone now for approximately 2 months we will check labs including an amiodarone level recheck renal function with CKD and liver function and plan outpatient cardioversion after the holidays. 2. Continue amiodarone check levels and check labs for toxicity if she fails to convert we will stop the drug 3. Stable continue her current medications recheck renal function 4. Stable her exercise intolerance is out of proportion to COPD due to atrial fibrillation stable managed by her PCP   Next appointment: 4 weeks   Medication Adjustments/Labs and Tests Ordered: Current medicines are reviewed at length with the patient today.  Concerns regarding medicines are outlined above.  No orders of the defined types were placed in this encounter.  No orders of the defined types were placed in this encounter.   Chief Complaint  Patient presents with  . Atrial Fibrillation    recently starteds on amiodarone    History of Present Illness:    Melanie Montes is a 82 y.o. female with a hx of HF, Dyslipidemia, HTN , carotid stenosis CKD and SVT/PAT  persistent AF and anticoagulated  last seen  05/17/09 and started on amiodarone  She is unimproved since initiating amiodarone and finds her self fatigued anything more than minor activities and is unhappy with the quality of her life no edema shortness of breath at rest orthopnea chest pain palpitation or syncope and prior to leaving the office will check an EKG and confirm that she remains in rate controlled atrial fibrillation.  After discussion of options benefits and risks she elects to undergo repeat attempted cardioversion with amiodarone if unsuccessful we will stop the drug Past Medical History:  Diagnosis Date  . Abnormal CT scan, chest 05/02/2012   Followed in Pulmonary clinic/ [facility]/ [place]    - CT Chest 04/17/12:  Bilateral clustered nodules both upper lobes and sup segment of lower lobes     . Anemia, pernicious   . Breast cancer ([place])    Left  . Breast cancer of upper-outer quadrant of left female breast ([place]) 01/19/2016  . Carotid artery occlusion without infarction   . Chest pain in adult 06/30/2015   Overview:  Overview:  Stress echo negative for ischemia at 7 mets July 2016 Overview:  Stress echo negative for ischemia at 7 mets July 2016  . Chronic diastolic heart failure ([place]) 06/30/2015  . CKD (chronic kidney disease) 06/30/2015  . Colonic polyp   . COPD (chronic obstructive pulmonary disease) ([place]) 06/02/2012   Followed in Pulmonary clinic/ [facility]/ [place]    - PFT's 06/02/2012 FEV1  1.72 (86%) ratio 68 with marked air trapping  And  DLCO 67%   . Cough 05/02/2012   Followed in Pulmonary clinic/ [facility]/ [place]    . Degeneration of lumbar or lumbosacral intervertebral disc   . Edema   . GERD (gastroesophageal reflux disease)   . HTN (hypertension), benign   . Hyperlipidemia   . Hypertensive heart and chronic kidney disease with heart failure and stage 1 through stage 4 chronic kidney disease, or unspecified chronic kidney disease ([place]) 06/30/2015  . Restless legs     Past Surgical History:    Procedure Laterality Date  . ABDOMINAL HYSTERECTOMY  1970  . BREAST MASS EXCISION  2009   left//malignant  . CARDIOVERSION N/A 03/09/2018   Procedure: CARDIOVERSION;  Surgeon: Larey Dresser, MD;  Location[facility] ENDOSCOPY;  Service: Cardiovascular;  Laterality: N/A;  . SPINE SURGERY  1980   lumbar    Current Medications: Current Meds  Medication Sig  . Albuterol Sulfate (PROAIR RESPICLICK) 448 (90 Base) MCG/ACT AEPB Inhale 2 puffs into the lungs every 6 (six) hours as needed (shortness of breath).   . ALPRAZolam (XANAX) 0.25 MG tablet Take 0.25 mg by mouth daily as needed for anxiety.   Marland Kitchen amiodarone (PACERONE) 200 MG tablet TAKE ONE TABLET BY MOUTH TWICE DAILY FOR 2 WEEKS, THEN TAKE 1 TABLET DAILY.  Marland Kitchen amLODipine (NORVASC) 5 MG tablet Take 5 mg by mouth daily.  . cyanocobalamin (,VITAMIN B-12,) 1000 MCG/ML injection Inject 1,000 mcg into the muscle every 30 (thirty) days.  Marland Kitchen ELIQUIS 5 MG TABS tablet TAKE 1 TABLET TWICE DAILY.  Marland Kitchen Glucosamine-Chondroit-Vit C-Mn (GLUCOSAMINE 1500 COMPLEX PO) Take 1,500 mg by mouth 2 (two) times daily.   . metoprolol (LOPRESSOR) 100 MG tablet Take 100 mg by mouth 2 (two) times daily.  . niacin 500 MG tablet Take 1,000 mg by mouth at bedtime.   . Omega-3 Fatty Acids (FISH OIL) 1200 MG CAPS Take 1,200 mg by mouth 2 (two) times daily.   . potassium chloride (K-DUR,KLOR-CON) 10 MEQ tablet Take 10 mEq by mouth daily.  . ranitidine (ZANTAC) 150 MG tablet Take 150 mg by mouth 2 (two) times daily.   . Red Yeast Rice 600 MG TABS Take 600 mg by mouth 2 (two) times daily.   Marland Kitchen torsemide (DEMADEX) 20 MG tablet Take 20 mg by mouth 2 (two) times daily.  . vitamin E 1000 UNIT capsule Take 1,000 Units by mouth daily.     Allergies:   Penicillins and Statins   Social History   Socioeconomic History  . Marital status: Married    Spouse name: Not on file  . Number of children: Not on file  . Years of education: Not on file  . Highest education level: Not on file   Occupational History  . Occupation: retired  Scientific laboratory technician  . Financial resource strain: Not on file  . Food insecurity:    Worry: Not on file    Inability: Not on file  . Transportation needs:    Medical: Not on file    Non-medical: Not on file  Tobacco Use  . Smoking status: Former Smoker    Packs/day: 1.00    Years: 20.00    Pack years: 20.00    Types: Cigarettes    Last attempt to quit: 07/26/2000    Years since quitting: 17.9  . Smokeless tobacco: Never Used  Substance and Sexual Activity  . Alcohol use: No  . Drug use: No  . Sexual activity: Not on file  Lifestyle  . Physical activity:  Days per week: Not on file    Minutes per session: Not on file  . Stress: Not on file  Relationships  . Social connections:    Talks on phone: Not on file    Gets together: Not on file    Attends religious service: Not on file    Active member of club or organization: Not on file    Attends meetings of clubs or organizations: Not on file    Relationship status: Not on file  Other Topics Concern  . Not on file  Social History Narrative  . Not on file     Family History: The patient's family history includes Alzheimer's disease in her mother; Aneurysm in her mother; Colon cancer in her father and paternal aunt; Heart disease in her maternal grandmother and paternal grandmother; Stomach cancer in her paternal grandmother; Stroke in her mother; Transient ischemic attack in her father. ROS:   Please see the history of present illness.    All other systems reviewed and are negative.  EKGs/Labs/Other Studies Reviewed:    The following studies were reviewed today:  EKG:  EKG ordered today.  The ekg ordered today demonstrates persistent atrial fibrillation rate controlled  Recent Labs: 02/16/2018: NT-Pro BNP 7,090 03/06/2018: BUN 41; Creatinine, Ser 2.01; Hemoglobin 13.8; Platelets 235; Potassium 4.0; Sodium 141  Recent Lipid Panel No results found for: CHOL, TRIG, HDL, CHOLHDL,  VLDL, LDLCALC, LDLDIRECT  Physical Exam:    VS:  BP 114/68 (BP Location: Right Arm, Patient Position: Sitting, Cuff Size: Normal)   Pulse 85   Ht 5' 4.75" (1.645 m)   Wt 195 lb 6 oz (88.6 kg)   SpO2 96%   BMI 32.76 kg/m     Wt Readings from Last 3 Encounters:  07/12/18 195 lb 6 oz (88.6 kg)  05/24/18 201 lb 3.2 oz (91.3 kg)  05/17/18 199 lb 3.2 oz (90.4 kg)     GEN:  Well nourished, well developed in no acute distress HEENT: Normal NECK: No JVD; No carotid bruits LYMPHATICS: No lymphadenopathy CARDIAC: Irregular rhythm variable first heart sound distant heart sounds no murmurs, rubs, gallops RESPIRATORY:  Clear to auscultation without rales, wheezing or rhonchi  ABDOMEN: Soft, non-tender, non-distended MUSCULOSKELETAL:  No edema; No deformity  SKIN: Warm and dry NEUROLOGIC:  Alert and oriented x 3 PSYCHIATRIC:  Normal affect    Signed, Shirlee More, MD  07/12/2018 10:14 AM    [place]

## 2018-07-10 NOTE — H&P (View-Only) (Signed)
Cardiology Office Note:    Date:  07/12/2018   ID:  Melanie Montes, DOB June 06, 1936, MRN [id number]  PCP:  Nicoletta Dress, MD  Cardiologist:  Shirlee More, MD    Referring MD: Nicoletta Dress, MD    ASSESSMENT:    1. Persistent atrial fibrillation   2. On amiodarone therapy   3. Hypertensive heart and chronic kidney disease with heart failure and stage 1 through stage 4 chronic kidney disease, or unspecified chronic kidney disease ([place])   4. Chronic obstructive pulmonary disease, unspecified COPD type ([place])    PLAN:    In order of problems listed above:  1. She continues in atrial fibrillation on amiodarone.  She has a marked reduction in her functional abilities and before excepting persistent atrial fibrillation or chronic we will try 1 more attempt at cardioversion as she has been on amiodarone now for approximately 2 months we will check labs including an amiodarone level recheck renal function with CKD and liver function and plan outpatient cardioversion after the holidays. 2. Continue amiodarone check levels and check labs for toxicity if she fails to convert we will stop the drug 3. Stable continue her current medications recheck renal function 4. Stable her exercise intolerance is out of proportion to COPD due to atrial fibrillation stable managed by her PCP   Next appointment: 4 weeks   Medication Adjustments/Labs and Tests Ordered: Current medicines are reviewed at length with the patient today.  Concerns regarding medicines are outlined above.  No orders of the defined types were placed in this encounter.  No orders of the defined types were placed in this encounter.   Chief Complaint  Patient presents with  . Atrial Fibrillation    recently starteds on amiodarone    History of Present Illness:    Melanie Montes is a 82 y.o. female with a hx of HF, Dyslipidemia, HTN , carotid stenosis CKD and SVT/PAT  persistent AF and anticoagulated  last seen  05/17/09 and started on amiodarone  She is unimproved since initiating amiodarone and finds her self fatigued anything more than minor activities and is unhappy with the quality of her life no edema shortness of breath at rest orthopnea chest pain palpitation or syncope and prior to leaving the office will check an EKG and confirm that she remains in rate controlled atrial fibrillation.  After discussion of options benefits and risks she elects to undergo repeat attempted cardioversion with amiodarone if unsuccessful we will stop the drug Past Medical History:  Diagnosis Date  . Abnormal CT scan, chest 05/02/2012   Followed in Pulmonary clinic/ [facility]/ [place]    - CT Chest 04/17/12:  Bilateral clustered nodules both upper lobes and sup segment of lower lobes     . Anemia, pernicious   . Breast cancer ([place])    Left  . Breast cancer of upper-outer quadrant of left female breast ([place]) 01/19/2016  . Carotid artery occlusion without infarction   . Chest pain in adult 06/30/2015   Overview:  Overview:  Stress echo negative for ischemia at 7 mets July 2016 Overview:  Stress echo negative for ischemia at 7 mets July 2016  . Chronic diastolic heart failure ([place]) 06/30/2015  . CKD (chronic kidney disease) 06/30/2015  . Colonic polyp   . COPD (chronic obstructive pulmonary disease) ([place]) 06/02/2012   Followed in Pulmonary clinic/ [facility]/ [place]    - PFT's 06/02/2012 FEV1  1.72 (86%) ratio 68 with marked air trapping  And  DLCO 67%   . Cough 05/02/2012   Followed in Pulmonary clinic/ [facility]/ [place]    . Degeneration of lumbar or lumbosacral intervertebral disc   . Edema   . GERD (gastroesophageal reflux disease)   . HTN (hypertension), benign   . Hyperlipidemia   . Hypertensive heart and chronic kidney disease with heart failure and stage 1 through stage 4 chronic kidney disease, or unspecified chronic kidney disease ([place]) 06/30/2015  . Restless legs     Past Surgical History:    Procedure Laterality Date  . ABDOMINAL HYSTERECTOMY  1970  . BREAST MASS EXCISION  2009   left//malignant  . CARDIOVERSION N/A 03/09/2018   Procedure: CARDIOVERSION;  Surgeon: Larey Dresser, MD;  Location: [facility] ENDOSCOPY;  Service: Cardiovascular;  Laterality: N/A;  . SPINE SURGERY  1980   lumbar    Current Medications: Current Meds  Medication Sig  . Albuterol Sulfate (PROAIR RESPICLICK) 338 (90 Base) MCG/ACT AEPB Inhale 2 puffs into the lungs every 6 (six) hours as needed (shortness of breath).   . ALPRAZolam (XANAX) 0.25 MG tablet Take 0.25 mg by mouth daily as needed for anxiety.   Marland Kitchen amiodarone (PACERONE) 200 MG tablet TAKE ONE TABLET BY MOUTH TWICE DAILY FOR 2 WEEKS, THEN TAKE 1 TABLET DAILY.  Marland Kitchen amLODipine (NORVASC) 5 MG tablet Take 5 mg by mouth daily.  . cyanocobalamin (,VITAMIN B-12,) 1000 MCG/ML injection Inject 1,000 mcg into the muscle every 30 (thirty) days.  Marland Kitchen ELIQUIS 5 MG TABS tablet TAKE 1 TABLET TWICE DAILY.  Marland Kitchen Glucosamine-Chondroit-Vit C-Mn (GLUCOSAMINE 1500 COMPLEX PO) Take 1,500 mg by mouth 2 (two) times daily.   . metoprolol (LOPRESSOR) 100 MG tablet Take 100 mg by mouth 2 (two) times daily.  . niacin 500 MG tablet Take 1,000 mg by mouth at bedtime.   . Omega-3 Fatty Acids (FISH OIL) 1200 MG CAPS Take 1,200 mg by mouth 2 (two) times daily.   . potassium chloride (K-DUR,KLOR-CON) 10 MEQ tablet Take 10 mEq by mouth daily.  . ranitidine (ZANTAC) 150 MG tablet Take 150 mg by mouth 2 (two) times daily.   . Red Yeast Rice 600 MG TABS Take 600 mg by mouth 2 (two) times daily.   Marland Kitchen torsemide (DEMADEX) 20 MG tablet Take 20 mg by mouth 2 (two) times daily.  . vitamin E 1000 UNIT capsule Take 1,000 Units by mouth daily.     Allergies:   Penicillins and Statins   Social History   Socioeconomic History  . Marital status: Married    Spouse name: Not on file  . Number of children: Not on file  . Years of education: Not on file  . Highest education level: Not on file   Occupational History  . Occupation: retired  Scientific laboratory technician  . Financial resource strain: Not on file  . Food insecurity:    Worry: Not on file    Inability: Not on file  . Transportation needs:    Medical: Not on file    Non-medical: Not on file  Tobacco Use  . Smoking status: Former Smoker    Packs/day: 1.00    Years: 20.00    Pack years: 20.00    Types: Cigarettes    Last attempt to quit: 07/26/2000    Years since quitting: 17.9  . Smokeless tobacco: Never Used  Substance and Sexual Activity  . Alcohol use: No  . Drug use: No  . Sexual activity: Not on file  Lifestyle  . Physical activity:  Days per week: Not on file    Minutes per session: Not on file  . Stress: Not on file  Relationships  . Social connections:    Talks on phone: Not on file    Gets together: Not on file    Attends religious service: Not on file    Active member of club or organization: Not on file    Attends meetings of clubs or organizations: Not on file    Relationship status: Not on file  Other Topics Concern  . Not on file  Social History Narrative  . Not on file     Family History: The patient's family history includes Alzheimer's disease in her mother; Aneurysm in her mother; Colon cancer in her father and paternal aunt; Heart disease in her maternal grandmother and paternal grandmother; Stomach cancer in her paternal grandmother; Stroke in her mother; Transient ischemic attack in her father. ROS:   Please see the history of present illness.    All other systems reviewed and are negative.  EKGs/Labs/Other Studies Reviewed:    The following studies were reviewed today:  EKG:  EKG ordered today.  The ekg ordered today demonstrates persistent atrial fibrillation rate controlled  Recent Labs: 02/16/2018: NT-Pro BNP 7,090 03/06/2018: BUN 41; Creatinine, Ser 2.01; Hemoglobin 13.8; Platelets 235; Potassium 4.0; Sodium 141  Recent Lipid Panel No results found for: CHOL, TRIG, HDL, CHOLHDL,  VLDL, LDLCALC, LDLDIRECT  Physical Exam:    VS:  BP 114/68 (BP Location: Right Arm, Patient Position: Sitting, Cuff Size: Normal)   Pulse 85   Ht 5' 4.75" (1.645 m)   Wt 195 lb 6 oz (88.6 kg)   SpO2 96%   BMI 32.76 kg/m     Wt Readings from Last 3 Encounters:  07/12/18 195 lb 6 oz (88.6 kg)  05/24/18 201 lb 3.2 oz (91.3 kg)  05/17/18 199 lb 3.2 oz (90.4 kg)     GEN:  Well nourished, well developed in no acute distress HEENT: Normal NECK: No JVD; No carotid bruits LYMPHATICS: No lymphadenopathy CARDIAC: Irregular rhythm variable first heart sound distant heart sounds no murmurs, rubs, gallops RESPIRATORY:  Clear to auscultation without rales, wheezing or rhonchi  ABDOMEN: Soft, non-tender, non-distended MUSCULOSKELETAL:  No edema; No deformity  SKIN: Warm and dry NEUROLOGIC:  Alert and oriented x 3 PSYCHIATRIC:  Normal affect    Signed, Shirlee More, MD  07/12/2018 10:14 AM    [place]

## 2018-07-12 ENCOUNTER — Ambulatory Visit (INDEPENDENT_AMBULATORY_CARE_PROVIDER_SITE_OTHER): Payer: PPO | Admitting: Cardiology

## 2018-07-12 ENCOUNTER — Encounter: Payer: Self-pay | Admitting: Cardiology

## 2018-07-12 VITALS — BP 114/68 | HR 85 | Ht 64.75 in | Wt 195.4 lb

## 2018-07-12 DIAGNOSIS — J449 Chronic obstructive pulmonary disease, unspecified: Secondary | ICD-10-CM

## 2018-07-12 DIAGNOSIS — I130 Hypertensive heart and chronic kidney disease with heart failure and stage 1 through stage 4 chronic kidney disease, or unspecified chronic kidney disease: Secondary | ICD-10-CM

## 2018-07-12 DIAGNOSIS — Z79899 Other long term (current) drug therapy: Secondary | ICD-10-CM

## 2018-07-12 DIAGNOSIS — I4819 Other persistent atrial fibrillation: Secondary | ICD-10-CM | POA: Diagnosis not present

## 2018-07-12 NOTE — Patient Instructions (Addendum)
Medication Instructions:  Your physician recommends that you continue on your current medications as directed. Please refer to the Current Medication list given to you today.  If you need a refill on your cardiac medications before your next appointment, please call your pharmacy.   Lab work: You will have lab work today.  CMP, TSH, Amiodarone level  If you have labs (blood work) drawn today and your tests are completely normal, you will receive your results only by: Marland Kitchen MyChart Message (if you have MyChart) OR . A paper copy in the mail If you have any lab test that is abnormal or we need to change your treatment, we will call you to review the results.   Please come to our office on 07-24-18 for lab work prior to cardioversion BMP and CBC  Testing/Procedures:  You are scheduled for a  Cardioversion on 07-28-2018 with Dr. Stanford Breed.  Please arrive at the [facility] (Main Entrance A) at [facility]: [address] [place], [place] [address] at 8:45 am (1 hour prior to procedure unless lab work is needed; if lab work is needed arrive 1.5 hours ahead)  DIET: Nothing to eat or drink after midnight except a sip of water with medications (see medication instructions below)  Medication Instructions: Hold torsemide on the morning of the test   Continue your anticoagulant: Eliquis You will need to continue your anticoagulant after your procedure until you are told by your provider that it is safe to stop.   Labs: If patient is on Coumadin, patient needs pt/INR, CBC, BMET within 3 days (No pt/INR needed for patients taking Xarelto, Eliquis, Pradaxa) For patients receiving anesthesia for TEE and all Cardioversion patients: BMET, CBC within 1 week   You must have a responsible person to drive you home and stay in the waiting area during your procedure. Failure to do so could result in cancellation.  Bring your insurance cards.  *Special Note: Every effort is made to have your procedure  done on time. Occasionally there are emergencies that occur at the hospital that may cause delays. Please be patient if a delay does occur.    Follow-Up: At [facility], you and your health needs are our priority.  As part of our continuing mission to provide you with exceptional heart care, we have created designated Provider Care Teams.  These Care Teams include your primary Cardiologist (physician) and Advanced Practice Providers (APPs -  Physician Assistants and Nurse Practitioners) who all work together to provide you with the care you need, when you need it. You will need a follow up appointment in 4 weeks.  Please call our office 2 months in advance to schedule this appointment.     Electrical Cardioversion  Electrical cardioversion is the delivery of a jolt of electricity to restore a normal rhythm to the heart. A rhythm that is too fast or is not regular keeps the heart from pumping well. In this procedure, sticky patches or metal paddles are placed on the chest to deliver electricity to the heart from a device. This procedure may be done in an emergency if:  There is low or no blood pressure as a result of the heart rhythm.  Normal rhythm must be restored as fast as possible to protect the brain and heart from further damage.  It may save a life. This procedure may also be done for irregular or fast heart rhythms that are not immediately life-threatening. Tell a health care provider about:  Any allergies  you have.  All medicines you are taking, including vitamins, herbs, eye drops, creams, and over-the-counter medicines.  Any problems you or family members have had with anesthetic medicines.  Any blood disorders you have.  Any surgeries you have had.  Any medical conditions you have.  Whether you are pregnant or may be pregnant. What are the risks? Generally, this is a safe procedure. However, problems may occur, including:  Allergic reactions to medicines.  A  blood clot that breaks free and travels to other parts of your body.  The possible return of an abnormal heart rhythm within hours or days after the procedure.  Your heart stopping (cardiac arrest). This is rare. What happens before the procedure? Medicines  Your health care provider may have you start taking: ? Blood-thinning medicines (anticoagulants) so your blood does not clot as easily. ? Medicines may be given to help stabilize your heart rate and rhythm.  Ask your health care provider about changing or stopping your regular medicines. This is especially important if you are taking diabetes medicines or blood thinners. General instructions  Plan to have someone take you home from the hospital or clinic.  If you will be going home right after the procedure, plan to have someone with you for 24 hours.  Follow instructions from your health care provider about eating or drinking restrictions. What happens during the procedure?  To lower your risk of infection: ? Your health care team will wash or sanitize their hands. ? Your skin will be washed with soap.  An IV tube will be inserted into one of your veins.  You will be given a medicine to help you relax (sedative).  Sticky patches (electrodes) or metal paddles may be placed on your chest.  An electrical shock will be delivered. The procedure may vary among health care providers and hospitals. What happens after the procedure?   Your blood pressure, heart rate, breathing rate, and blood oxygen level will be monitored until the medicines you were given have worn off.  Do not drive for 24 hours if you were given a sedative.  Your heart rhythm will be watched to make sure it does not change. This information is not intended to replace advice given to you by your health care provider. Make sure you discuss any questions you have with your health care provider. Document Released: 07/02/2002 Document Revised: 03/10/2016  Document Reviewed: 01/16/2016 Elsevier Interactive Patient Education  2019 [employer].

## 2018-07-13 LAB — COMPREHENSIVE METABOLIC PANEL
ALT: 14 IU/L (ref 0–32)
AST: 16 IU/L (ref 0–40)
Albumin/Globulin Ratio: 1.7 (ref 1.2–2.2)
Albumin: 4.3 g/dL (ref 3.5–4.7)
Alkaline Phosphatase: 83 IU/L (ref 39–117)
BUN/Creatinine Ratio: 18 (ref 12–28)
BUN: 37 mg/dL — ABNORMAL HIGH (ref 8–27)
Bilirubin Total: 0.7 mg/dL (ref 0.0–1.2)
CHLORIDE: 95 mmol/L — AB (ref 96–106)
CO2: 26 mmol/L (ref 20–29)
Calcium: 8.9 mg/dL (ref 8.7–10.3)
Creatinine, Ser: 2.04 mg/dL — ABNORMAL HIGH (ref 0.57–1.00)
GFR calc Af Amer: 26 mL/min/{1.73_m2} — ABNORMAL LOW (ref >59)
GFR calc non Af Amer: 23 mL/min/{1.73_m2} — ABNORMAL LOW (ref >59)
Globulin, Total: 2.5 g/dL (ref 1.5–4.5)
Glucose: 89 mg/dL (ref 65–99)
Potassium: 3.1 mmol/L — ABNORMAL LOW (ref 3.5–5.2)
Sodium: 142 mmol/L (ref 134–144)
Total Protein: 6.8 g/dL (ref 6.0–8.5)

## 2018-07-13 LAB — AMIODARONE LEVEL
Amiodarone, Serum: 1.4 ug/mL (ref 1.0–2.5)
Noramiodarone,S: 0.6 ug/mL — ABNORMAL LOW (ref 1.0–2.5)

## 2018-07-13 LAB — TSH: TSH: 7.47 u[IU]/mL — ABNORMAL HIGH (ref 0.450–4.500)

## 2018-07-14 ENCOUNTER — Telehealth: Payer: Self-pay

## 2018-07-14 MED ORDER — AMIODARONE HCL 200 MG PO TABS: ORAL_TABLET | ORAL | 0 refills | Status: DC

## 2018-07-14 NOTE — Telephone Encounter (Signed)
-----   Message from Richardo Priest, MD sent at 07/13/2018  5:28 PM EST ----- Normal or stable result  Okay for cardioversion, active meds amiodarone level is low increase to twice daily until seen back in the office by me post cardioversion equals 400 mg/day  TSH is mildly elevated nonspecific

## 2018-07-14 NOTE — Telephone Encounter (Signed)
Patient advised of lab results.  Advised that she should increase amiodarone to 200mg  twice daily until she is seen by Dr Bettina Gavia after cardioversion.  Patient verbalized understanding.

## 2018-07-18 DIAGNOSIS — J208 Acute bronchitis due to other specified organisms: Secondary | ICD-10-CM | POA: Diagnosis not present

## 2018-07-21 ENCOUNTER — Telehealth: Payer: Self-pay | Admitting: *Deleted

## 2018-07-21 MED ORDER — MULTIVITAMINS PO CAPS: 1.0000 | ORAL_CAPSULE | Freq: Every day | ORAL | Status: DC

## 2018-07-21 MED ORDER — ASPIRIN EC 81 MG PO TBEC: DELAYED_RELEASE_TABLET | ORAL | 3 refills | Status: DC

## 2018-07-21 NOTE — Telephone Encounter (Signed)
Patient called after seeing her PCP. She reports that she has bronchitis and needs to reschedule her cardioversion on 07/28/2018. Patient has been rescheduled for Friday, 08/04/2018 at 9:15 am (arrival time is 8:15 am) with Dr. Radford Pax and advised to return to our office for lab work on Monday, 07/31/2018 or Tuesday, 08/01/2018, no appointment needed. Patient verbalized understanding. No further questions.

## 2018-08-01 DIAGNOSIS — I4819 Other persistent atrial fibrillation: Secondary | ICD-10-CM | POA: Diagnosis not present

## 2018-08-01 DIAGNOSIS — J449 Chronic obstructive pulmonary disease, unspecified: Secondary | ICD-10-CM | POA: Diagnosis not present

## 2018-08-01 DIAGNOSIS — Z79899 Other long term (current) drug therapy: Secondary | ICD-10-CM | POA: Diagnosis not present

## 2018-08-01 DIAGNOSIS — I130 Hypertensive heart and chronic kidney disease with heart failure and stage 1 through stage 4 chronic kidney disease, or unspecified chronic kidney disease: Secondary | ICD-10-CM | POA: Diagnosis not present

## 2018-08-02 LAB — CBC
HEMATOCRIT: 37.9 % (ref 34.0–46.6)
Hemoglobin: 12.7 g/dL (ref 11.1–15.9)
MCH: 28.7 pg (ref 26.6–33.0)
MCHC: 33.5 g/dL (ref 31.5–35.7)
MCV: 86 fL (ref 79–97)
Platelets: 213 10*3/uL (ref 150–450)
RBC: 4.42 x10E6/uL (ref 3.77–5.28)
RDW: 13.8 % (ref 11.7–15.4)
WBC: 7.9 10*3/uL (ref 3.4–10.8)

## 2018-08-02 LAB — BASIC METABOLIC PANEL
BUN/Creatinine Ratio: 18 (ref 12–28)
BUN: 32 mg/dL — ABNORMAL HIGH (ref 8–27)
CO2: 27 mmol/L (ref 20–29)
Calcium: 8.7 mg/dL (ref 8.7–10.3)
Chloride: 100 mmol/L (ref 96–106)
Creatinine, Ser: 1.78 mg/dL — ABNORMAL HIGH (ref 0.57–1.00)
GFR calc Af Amer: 31 mL/min/{1.73_m2} — ABNORMAL LOW (ref >59)
GFR calc non Af Amer: 27 mL/min/{1.73_m2} — ABNORMAL LOW (ref >59)
Glucose: 86 mg/dL (ref 65–99)
Potassium: 3.7 mmol/L (ref 3.5–5.2)
Sodium: 144 mmol/L (ref 134–144)

## 2018-08-04 ENCOUNTER — Ambulatory Visit (HOSPITAL_COMMUNITY): Payer: PPO | Admitting: Certified Registered"

## 2018-08-04 ENCOUNTER — Ambulatory Visit (HOSPITAL_COMMUNITY)
Admission: RE | Admit: 2018-08-04 | Discharge: 2018-08-04 | Disposition: A | Discharge status: Home / Self Care | Payer: PPO | Attending: Cardiology | Admitting: Cardiology

## 2018-08-04 ENCOUNTER — Encounter (HOSPITAL_COMMUNITY)
Admission: RE | Disposition: A | Discharge status: Home / Self Care | Payer: Self-pay | Source: Home / Self Care | Attending: Cardiology

## 2018-08-04 ENCOUNTER — Encounter (HOSPITAL_COMMUNITY): Payer: Self-pay | Admitting: *Deleted

## 2018-08-04 DIAGNOSIS — Z880 Allergy status to penicillin: Secondary | ICD-10-CM | POA: Insufficient documentation

## 2018-08-04 DIAGNOSIS — Z79899 Other long term (current) drug therapy: Secondary | ICD-10-CM | POA: Insufficient documentation

## 2018-08-04 DIAGNOSIS — E785 Hyperlipidemia, unspecified: Secondary | ICD-10-CM | POA: Insufficient documentation

## 2018-08-04 DIAGNOSIS — I4891 Unspecified atrial fibrillation: Secondary | ICD-10-CM | POA: Diagnosis not present

## 2018-08-04 DIAGNOSIS — K219 Gastro-esophageal reflux disease without esophagitis: Secondary | ICD-10-CM | POA: Insufficient documentation

## 2018-08-04 DIAGNOSIS — I130 Hypertensive heart and chronic kidney disease with heart failure and stage 1 through stage 4 chronic kidney disease, or unspecified chronic kidney disease: Secondary | ICD-10-CM | POA: Diagnosis not present

## 2018-08-04 DIAGNOSIS — J449 Chronic obstructive pulmonary disease, unspecified: Secondary | ICD-10-CM | POA: Diagnosis not present

## 2018-08-04 DIAGNOSIS — Z8249 Family history of ischemic heart disease and other diseases of the circulatory system: Secondary | ICD-10-CM | POA: Diagnosis not present

## 2018-08-04 DIAGNOSIS — Z87891 Personal history of nicotine dependence: Secondary | ICD-10-CM | POA: Insufficient documentation

## 2018-08-04 DIAGNOSIS — N184 Chronic kidney disease, stage 4 (severe): Secondary | ICD-10-CM | POA: Insufficient documentation

## 2018-08-04 DIAGNOSIS — I5032 Chronic diastolic (congestive) heart failure: Secondary | ICD-10-CM | POA: Diagnosis not present

## 2018-08-04 DIAGNOSIS — G2581 Restless legs syndrome: Secondary | ICD-10-CM | POA: Diagnosis not present

## 2018-08-04 DIAGNOSIS — I4819 Other persistent atrial fibrillation: Secondary | ICD-10-CM | POA: Diagnosis not present

## 2018-08-04 DIAGNOSIS — Z823 Family history of stroke: Secondary | ICD-10-CM | POA: Diagnosis not present

## 2018-08-04 DIAGNOSIS — Z90710 Acquired absence of both cervix and uterus: Secondary | ICD-10-CM | POA: Insufficient documentation

## 2018-08-04 DIAGNOSIS — Z7901 Long term (current) use of anticoagulants: Secondary | ICD-10-CM | POA: Insufficient documentation

## 2018-08-04 DIAGNOSIS — I480 Paroxysmal atrial fibrillation: Secondary | ICD-10-CM

## 2018-08-04 DIAGNOSIS — Z888 Allergy status to other drugs, medicaments and biological substances status: Secondary | ICD-10-CM | POA: Diagnosis not present

## 2018-08-04 HISTORY — PX: CARDIOVERSION: SHX1299

## 2018-08-04 SURGERY — CARDIOVERSION
Anesthesia: General

## 2018-08-04 MED ORDER — LIDOCAINE HCL (CARDIAC) PF 100 MG/5ML IV SOSY
PREFILLED_SYRINGE | INTRAVENOUS | Status: DC | PRN
  Administered 2018-08-04: 20 mg via INTRAVENOUS

## 2018-08-04 MED ORDER — PROPOFOL 10 MG/ML IV BOLUS
INTRAVENOUS | Status: DC | PRN
  Administered 2018-08-04: 50 mg via INTRAVENOUS

## 2018-08-04 MED ORDER — SODIUM CHLORIDE 0.9 % IV SOLN
INTRAVENOUS | Status: DC | PRN
  Administered 2018-08-04: 09:00:00 via INTRAVENOUS

## 2018-08-04 NOTE — CV Procedure (Signed)
   Electrical Cardioversion Procedure Note Melanie Montes [id number] 1936/03/25  Procedure: Electrical Cardioversion Indications:  Atrial Fibrillation  Time Out: Verified patient identification, verified procedure,medications/allergies/relevent history reviewed, required imaging and test results available.  Performed  Procedure Details  The patient was NPO after midnight. Anesthesia was administered at the beside  by Dr.Jackson with 50mg  of propofol and 20mg   Lidocaine.  Cardioversion was done with synchronized biphasic defibrillation with AP pads with 150watts.  The patient converted to normal sinus rhythm. The patient tolerated the procedure well   IMPRESSION:  Successful cardioversion of atrial fibrillation    Traci Turner 08/04/2018, 9:08 AM

## 2018-08-04 NOTE — Discharge Instructions (Signed)
Electrical Cardioversion, Care After °This sheet gives you information about how to care for yourself after your procedure. Your health care provider may also give you more specific instructions. If you have problems or questions, contact your health care provider. °What can I expect after the procedure? °After the procedure, it is common to have: °· Some redness on the skin where the shocks were given. °Follow these instructions at home: ° °· Do not drive for 24 hours if you were given a medicine to help you relax (sedative). °· Take over-the-counter and prescription medicines only as told by your health care provider. °· Ask your health care provider how to check your pulse. Check it often. °· Rest for 48 hours after the procedure or as told by your health care provider. °· Avoid or limit your caffeine use as told by your health care provider. °Contact a health care provider if: °· You feel like your heart is beating too quickly or your pulse is not regular. °· You have a serious muscle cramp that does not go away. °Get help right away if: ° °· You have discomfort in your chest. °· You are dizzy or you feel faint. °· You have trouble breathing or you are short of breath. °· Your speech is slurred. °· You have trouble moving an arm or leg on one side of your body. °· Your fingers or toes turn cold or blue. °This information is not intended to replace advice given to you by your health care provider. Make sure you discuss any questions you have with your health care provider. °Document Released: 05/02/2013 Document Revised: 02/13/2016 Document Reviewed: 01/16/2016 °Elsevier Interactive Patient Education © 2019 [employer] Inc[employer] ° °

## 2018-08-04 NOTE — Anesthesia Preprocedure Evaluation (Addendum)
Anesthesia Evaluation  Patient identified by MRN, date of birth, ID band Patient awake    Reviewed: Allergy & Precautions, NPO status , Patient's Chart, lab work & pertinent test results, reviewed documented beta blocker date and time   History of Anesthesia Complications Negative for: history of anesthetic complications  Airway Mallampati: I  TM Distance: >3 FB Neck ROM: Full    Dental  (+) Edentulous Upper, Edentulous Lower   Pulmonary COPD,  COPD inhaler, former smoker,    breath sounds clear to auscultation       Cardiovascular hypertension, Pt. on medications and Pt. on home beta blockers (-) angina+ Peripheral Vascular Disease  + dysrhythmias Atrial Fibrillation  Rhythm:Irregular Rate:Normal  '19 ECHO: EF 55-60%, mild MR   Neuro/Psych negative neurological ROS     GI/Hepatic Neg liver ROS, GERD  Medicated and Controlled,  Endo/Other  negative endocrine ROS  Renal/GU Renal InsufficiencyRenal disease (creat 1.78)     Musculoskeletal  (+) Arthritis , Osteoarthritis,    Abdominal (+) + obese,   Peds  Hematology eliquis   Anesthesia Other Findings Breast cancer  Reproductive/Obstetrics                            Anesthesia Physical Anesthesia Plan  ASA: III  Anesthesia Plan: General   Post-op Pain Management:    Induction: Intravenous  PONV Risk Score and Plan: 3 and Treatment may vary due to age or medical condition  Airway Management Planned: Mask and Natural Airway  Additional Equipment:   Intra-op Plan:   Post-operative Plan:   Informed Consent: I have reviewed the patients History and Physical, chart, labs and discussed the procedure including the risks, benefits and alternatives for the proposed anesthesia with the patient or authorized representative who has indicated his/her understanding and acceptance.     Plan Discussed with: CRNA and Surgeon  Anesthesia Plan  Comments:        Anesthesia Quick Evaluation

## 2018-08-04 NOTE — Interval H&P Note (Signed)
History and Physical Interval Note:  08/04/2018 9:07 AM  Melanie Montes  has presented today for surgery, with the diagnosis of AFIB  The various methods of treatment have been discussed with the patient and family. After consideration of risks, benefits and other options for treatment, the patient has consented to  Procedure(s): CARDIOVERSION (N/A) as a surgical intervention .  The patient's history has been reviewed, patient examined, no change in status, stable for surgery.  I have reviewed the patient's chart and labs.  Questions were answered to the patient's satisfaction.     Fransico Him

## 2018-08-04 NOTE — Transfer of Care (Signed)
Immediate Anesthesia Transfer of Care Note  Patient: Melanie Montes  Procedure(s) Performed: CARDIOVERSION (N/A )  Patient Location: Endoscopy Unit  Anesthesia Type:General  Level of Consciousness: awake, alert  and oriented  Airway & Oxygen Therapy: Patient Spontanous Breathing  Post-op Assessment: Report given to RN, Post -op Vital signs reviewed and stable and Patient moving all extremities X 4  Post vital signs: Reviewed and stable  Last Vitals:  Vitals Value Taken Time  BP    Temp    Pulse    Resp    SpO2      Last Pain:  Vitals:   08/04/18 0831  TempSrc: Oral  PainSc: 0-No pain         Complications: No apparent anesthesia complications

## 2018-08-04 NOTE — Anesthesia Postprocedure Evaluation (Signed)
Anesthesia Post Note  Patient: Melanie Montes  Procedure(s) Performed: CARDIOVERSION (N/A )     Patient location during evaluation: Endoscopy Anesthesia Type: General Level of consciousness: awake and alert, patient cooperative and oriented Pain management: pain level controlled Vital Signs Assessment: post-procedure vital signs reviewed and stable Respiratory status: spontaneous breathing, nonlabored ventilation and respiratory function stable Cardiovascular status: blood pressure returned to baseline and stable Postop Assessment: no apparent nausea or vomiting Anesthetic complications: no    Last Vitals:  Vitals:   08/04/18 0926 08/04/18 0940  BP: (!) 99/42 129/65  Pulse: 66 64  Resp: 18 17  Temp: 36.5 C   SpO2: 99% 98%    Last Pain:  Vitals:   08/04/18 0940  TempSrc:   PainSc: 0-No pain                 Ersilia Brawley,E. [place]unna Coco

## 2018-08-06 ENCOUNTER — Encounter (HOSPITAL_COMMUNITY): Payer: Self-pay | Admitting: Cardiology

## 2018-08-10 DIAGNOSIS — Z79899 Other long term (current) drug therapy: Secondary | ICD-10-CM | POA: Diagnosis not present

## 2018-08-10 DIAGNOSIS — E785 Hyperlipidemia, unspecified: Secondary | ICD-10-CM | POA: Diagnosis not present

## 2018-08-10 DIAGNOSIS — I129 Hypertensive chronic kidney disease with stage 1 through stage 4 chronic kidney disease, or unspecified chronic kidney disease: Secondary | ICD-10-CM | POA: Diagnosis not present

## 2018-08-10 DIAGNOSIS — D519 Vitamin B12 deficiency anemia, unspecified: Secondary | ICD-10-CM | POA: Diagnosis not present

## 2018-08-10 DIAGNOSIS — I5032 Chronic diastolic (congestive) heart failure: Secondary | ICD-10-CM | POA: Diagnosis not present

## 2018-08-10 DIAGNOSIS — D638 Anemia in other chronic diseases classified elsewhere: Secondary | ICD-10-CM | POA: Diagnosis not present

## 2018-08-10 DIAGNOSIS — Z6834 Body mass index (BMI) 34.0-34.9, adult: Secondary | ICD-10-CM | POA: Diagnosis not present

## 2018-08-10 DIAGNOSIS — N183 Chronic kidney disease, stage 3 (moderate): Secondary | ICD-10-CM | POA: Diagnosis not present

## 2018-08-10 DIAGNOSIS — I4891 Unspecified atrial fibrillation: Secondary | ICD-10-CM | POA: Diagnosis not present

## 2018-08-11 ENCOUNTER — Ambulatory Visit: Payer: PPO | Admitting: Cardiology

## 2018-08-11 ENCOUNTER — Ambulatory Visit (INDEPENDENT_AMBULATORY_CARE_PROVIDER_SITE_OTHER): Payer: PPO | Admitting: Cardiology

## 2018-08-11 ENCOUNTER — Encounter: Payer: Self-pay | Admitting: *Deleted

## 2018-08-11 VITALS — BP 142/66 | HR 59 | Ht 64.75 in | Wt 200.6 lb

## 2018-08-11 DIAGNOSIS — Z7901 Long term (current) use of anticoagulants: Secondary | ICD-10-CM | POA: Diagnosis not present

## 2018-08-11 DIAGNOSIS — I480 Paroxysmal atrial fibrillation: Secondary | ICD-10-CM | POA: Diagnosis not present

## 2018-08-11 DIAGNOSIS — I130 Hypertensive heart and chronic kidney disease with heart failure and stage 1 through stage 4 chronic kidney disease, or unspecified chronic kidney disease: Secondary | ICD-10-CM | POA: Diagnosis not present

## 2018-08-11 DIAGNOSIS — Z79899 Other long term (current) drug therapy: Secondary | ICD-10-CM

## 2018-08-11 MED ORDER — METOPROLOL TARTRATE 50 MG PO TABS: 50.0000 mg | ORAL_TABLET | Freq: Two times a day (BID) | ORAL | 1 refills | Status: DC

## 2018-08-11 MED ORDER — AMIODARONE HCL 200 MG PO TABS: ORAL_TABLET | ORAL | 1 refills | Status: DC

## 2018-08-11 NOTE — Progress Notes (Signed)
Cardiology Office Note:    Date:  08/11/2018   ID:  Melanie Montes, DOB [birth year], MRN [id number]  PCP:  Nicoletta Dress, MD  Cardiologist:  Shirlee More, MD    Referring MD: Nicoletta Dress, MD    ASSESSMENT:    1. Paroxysmal atrial fibrillation (HCC)   2. On amiodarone therapy   3. Chronic anticoagulation   4. Hypertensive heart and chronic kidney disease with heart failure and stage 1 through stage 4 chronic kidney disease, or unspecified chronic kidney disease ([place])    PLAN:    In order of problems listed above:  1. She is improved maintaining sinus rhythm will begin to reduce her dose amiodarone and I asked her to continue her current anticoagulant.  Labs are being followed she just had them done yesterday with her PCP she may need to start thyroid supplement at some point in time.  I will also have her decrease her beta-blocker dose to 50%. 2. Continue amiodarone Ax[facility] labs performed with her PCP she needs CMP thyroid every 3 months 3. Continue her anticoagulant 4. Stable blood pressure target weight labs for repeat kidney function   Next appointment: 2 months   Medication Adjustments/Labs and Tests Ordered: Current medicines are reviewed at length with the patient today.  Concerns regarding medicines are outlined above.  No orders of the defined types were placed in this encounter.  No orders of the defined types were placed in this encounter.   No chief complaint on file.   History of Present Illness:    Melanie Montes is a 83 y.o. female with a hx of HF, Dyslipidemia, HTN , carotid stenosis CKD and SVT/PAT  persistent AF and anticoagulated  last seen 07/12/18 and successfully cardioverted to [facility] on amiodarone.. Compliance with diet, lifestyle and medications: Yes  She is remarkably improved and feels normal she is very vigorous and active and no longer has exercise intolerance.  She has had no edema shortness of breath chest pain palpitation or syncope  and tolerates amiodarone and her anticoagulant without complication. Past Medical History:  Diagnosis Date  . Abnormal CT scan, chest 05/02/2012   Followed in Pulmonary clinic/ [facility]/ [place]    - CT Chest 04/17/12:  Bilateral clustered nodules both upper lobes and sup segment of lower lobes     . Anemia, pernicious   . Breast cancer ([place])    Left  . Breast cancer of upper-outer quadrant of left female breast ([place]) 01/19/2016  . Carotid artery occlusion without infarction   . Chest pain in adult 06/30/2015   Overview:  Overview:  Stress echo negative for ischemia at 7 mets July 2016 Overview:  Stress echo negative for ischemia at 7 mets July 2016  . Chronic diastolic heart failure ([place]) 06/30/2015  . CKD (chronic kidney disease) 06/30/2015  . Colonic polyp   . COPD (chronic obstructive pulmonary disease) ([place]) 06/02/2012   Followed in Pulmonary clinic/ [facility]/ [place]    - PFT's 06/02/2012 FEV1  1.72 (86%) ratio 68 with marked air trapping  And DLCO 67%   . Cough 05/02/2012   Followed in Pulmonary clinic/ [facility]/ [place]    . Degeneration of lumbar or lumbosacral intervertebral disc   . Edema   . GERD (gastroesophageal reflux disease)   . HTN (hypertension), benign   . Hyperlipidemia   . Hypertensive heart and chronic kidney disease with heart failure and stage 1 through stage 4 chronic kidney disease, or unspecified chronic kidney disease (  [place]) 06/30/2015  . Restless legs     Past Surgical History:  Procedure Laterality Date  . ABDOMINAL HYSTERECTOMY  1970  . BREAST MASS EXCISION  2009   left//malignant  . CARDIOVERSION N/A 03/09/2018   Procedure: CARDIOVERSION;  Surgeon: Larey Dresser, MD;  Location: [facility] ENDOSCOPY;  Service: Cardiovascular;  Laterality: N/A;  . CARDIOVERSION N/A 08/04/2018   Procedure: CARDIOVERSION;  Surgeon: Sueanne Margarita, MD;  Location: [facility] ENDOSCOPY;  Service: Cardiovascular;  Laterality: N/A;  . SPINE SURGERY  1980   lumbar     Current Medications: No outpatient medications have been marked as taking for the 08/11/18 encounter (Appointment) with Richardo Priest, MD.     Allergies:   Penicillins and Statins   Social History   Socioeconomic History  . Marital status: Married    Spouse name: Not on file  . Number of children: Not on file  . Years of education: Not on file  . Highest education level: Not on file  Occupational History  . Occupation: retired  Scientific laboratory technician  . Financial resource strain: Not on file  . Food insecurity:    Worry: Not on file    Inability: Not on file  . Transportation needs:    Medical: Not on file    Non-medical: Not on file  Tobacco Use  . Smoking status: Former Smoker    Packs/day: 1.00    Years: 20.00    Pack years: 20.00    Types: Cigarettes    Last attempt to quit: 07/26/2000    Years since quitting: 18.0  . Smokeless tobacco: Never Used  Substance and Sexual Activity  . Alcohol use: No  . Drug use: No  . Sexual activity: Not on file  Lifestyle  . Physical activity:    Days per week: Not on file    Minutes per session: Not on file  . Stress: Not on file  Relationships  . Social connections:    Talks on phone: Not on file    Gets together: Not on file    Attends religious service: Not on file    Active member of club or organization: Not on file    Attends meetings of clubs or organizations: Not on file    Relationship status: Not on file  Other Topics Concern  . Not on file  Social History Narrative  . Not on file     Family History: The patient's family history includes Alzheimer's disease in her mother; Aneurysm in her mother; Colon cancer in her father and paternal aunt; Heart disease in her maternal grandmother and paternal grandmother; Stomach cancer in her paternal grandmother; Stroke in her mother; Transient ischemic attack in her father. ROS:   Please see the history of present illness.    All other systems reviewed and are  negative.  EKGs/Labs/Other Studies Reviewed:    The following studies were reviewed today:  EKG:  EKG ordered today.  The ekg ordered today demonstrates sinus bradycardia 59 bpm  Recent Labs: 02/16/2018: NT-Pro BNP 7,090 07/12/2018: ALT 14; TSH 7.470 08/01/2018: BUN 32; Creatinine, Ser 1.78; Hemoglobin 12.7; Platelets 213; Potassium 3.7; Sodium 144  Recent Lipid Panel No results found for: CHOL, TRIG, HDL, CHOLHDL, VLDL, LDLCALC, LDLDIRECT  Physical Exam:    VS:  There were no vitals taken for this visit.    Wt Readings from Last 3 Encounters:  08/04/18 195 lb (88.5 kg)  07/12/18 195 lb 6 oz (88.6 kg)  05/24/18 201 lb 3.2 oz (91.3  kg)     GEN:  Well nourished, well developed in no acute distress HEENT: Normal NECK: No JVD; No carotid bruits LYMPHATICS: No lymphadenopathy CARDIAC: RRR, no murmurs, rubs, gallops RESPIRATORY:  Clear to auscultation without rales, wheezing or rhonchi  ABDOMEN: Soft, non-tender, non-distended MUSCULOSKELETAL:  No edema; No deformity  SKIN: Warm and dry NEUROLOGIC:  Alert and oriented x 3 PSYCHIATRIC:  Normal affect    Signed, Shirlee More, MD  08/11/2018 8:06 AM    [place]

## 2018-08-11 NOTE — Patient Instructions (Signed)
Medication Instructions:  Your physician has recommended you make the following change in your medication:  DECREASE amiodarone (pacerone) 200 mg: Take 1 tablet twice daily on Monday, Wednesday, Friday and take 1 tablet daily every other day of the week DECREASE metoprolol tartrate (lopressor) 50 mg: Take 1 tablet twice daily  If you need a refill on your cardiac medications before your next appointment, please call your pharmacy.   Lab work: None  If you have labs (blood work) drawn today and your tests are completely normal, you will receive your results only by: Marland Kitchen MyChart Message (if you have MyChart) OR . A paper copy in the mail If you have any lab test that is abnormal or we need to change your treatment, we will call you to review the results.  Testing/Procedures: You had an EKG today.   Follow-Up: At [facility], you and your health needs are our priority.  As part of our continuing mission to provide you with exceptional heart care, we have created designated Provider Care Teams.  These Care Teams include your primary Cardiologist (physician) and Advanced Practice Providers (APPs -  Physician Assistants and Nurse Practitioners) who all work together to provide you with the care you need, when you need it. You will need a follow up appointment in 2 months.

## 2018-08-14 ENCOUNTER — Encounter: Payer: Self-pay | Admitting: Cardiology

## 2018-09-04 DIAGNOSIS — E039 Hypothyroidism, unspecified: Secondary | ICD-10-CM | POA: Diagnosis not present

## 2018-09-04 DIAGNOSIS — D519 Vitamin B12 deficiency anemia, unspecified: Secondary | ICD-10-CM | POA: Diagnosis not present

## 2018-10-03 ENCOUNTER — Other Ambulatory Visit: Payer: Self-pay | Admitting: Cardiology

## 2018-10-16 ENCOUNTER — Telehealth: Payer: Self-pay

## 2018-10-16 ENCOUNTER — Ambulatory Visit: Payer: PPO | Admitting: Cardiology

## 2018-10-16 NOTE — Telephone Encounter (Signed)
Patient's appointment was moved to May 2020.

## 2018-10-16 NOTE — Progress Notes (Deleted)
Cardiology Office Note:    Date:  10/16/2018   ID:  Melanie Montes, DOB 04/01/1936, MRN [id number]  PCP:  Nicoletta Dress, MD  Cardiologist:  Shirlee More, MD    Referring MD: Nicoletta Dress, MD    ASSESSMENT:    No diagnosis found. PLAN:    In order of problems listed above:  1. ***   Next appointment: ***   Medication Adjustments/Labs and Tests Ordered: Current medicines are reviewed at length with the patient today.  Concerns regarding medicines are outlined above.  No orders of the defined types were placed in this encounter.  No orders of the defined types were placed in this encounter.   No chief complaint on file.   History of Present Illness:    Melanie Montes is a 83 y.o. female with a hx of  HF, Dyslipidemia, HTN , carotid stenosis CKD and SVT/PAT  persistent AF and anticoagulated  and successfully cardioverted to [facility] on amiodarone..   last seen ***. Compliance with diet, lifestyle and medications: *** Past Medical History:  Diagnosis Date  . Abnormal CT scan, chest 05/02/2012   Followed in Pulmonary clinic/ [facility]/ [place]    - CT Chest 04/17/12:  Bilateral clustered nodules both upper lobes and sup segment of lower lobes     . Anemia, pernicious   . Breast cancer ([place])    Left  . Breast cancer of upper-outer quadrant of left female breast ([place]) 01/19/2016  . Carotid artery occlusion without infarction   . Chest pain in adult 06/30/2015   Overview:  Overview:  Stress echo negative for ischemia at 7 mets July 2016 Overview:  Stress echo negative for ischemia at 7 mets July 2016  . Chronic diastolic heart failure ([place]) 06/30/2015  . CKD (chronic kidney disease) 06/30/2015  . Colonic polyp   . COPD (chronic obstructive pulmonary disease) ([place]) 06/02/2012   Followed in Pulmonary clinic/ [facility]/ [place]    - PFT's 06/02/2012 FEV1  1.72 (86%) ratio 68 with marked air trapping  And DLCO 67%   . Cough 05/02/2012   Followed in Pulmonary  clinic/ [facility]/ [place]    . Degeneration of lumbar or lumbosacral intervertebral disc   . Edema   . GERD (gastroesophageal reflux disease)   . HTN (hypertension), benign   . Hyperlipidemia   . Hypertensive heart and chronic kidney disease with heart failure and stage 1 through stage 4 chronic kidney disease, or unspecified chronic kidney disease ([place]) 06/30/2015  . Restless legs     Past Surgical History:  Procedure Laterality Date  . ABDOMINAL HYSTERECTOMY  1970  . BREAST MASS EXCISION  2009   left//malignant  . CARDIOVERSION N/A 03/09/2018   Procedure: CARDIOVERSION;  Surgeon: Larey Dresser, MD;  Location: [facility] ENDOSCOPY;  Service: Cardiovascular;  Laterality: N/A;  . CARDIOVERSION N/A 08/04/2018   Procedure: CARDIOVERSION;  Surgeon: Sueanne Margarita, MD;  Location: [facility] ENDOSCOPY;  Service: Cardiovascular;  Laterality: N/A;  . SPINE SURGERY  1980   lumbar    Current Medications: No outpatient medications have been marked as taking for the 10/16/18 encounter (Appointment) with Richardo Priest, MD.     Allergies:   Penicillins and Statins   Social History   Socioeconomic History  . Marital status: Married    Spouse name: Not on file  . Number of children: Not on file  . Years of education: Not on file  . Highest education level: Not on file  Occupational History  .  Occupation: retired  Scientific laboratory technician  . Financial resource strain: Not on file  . Food insecurity:    Worry: Not on file    Inability: Not on file  . Transportation needs:    Medical: Not on file    Non-medical: Not on file  Tobacco Use  . Smoking status: Former Smoker    Packs/day: 1.00    Years: 20.00    Pack years: 20.00    Types: Cigarettes    Last attempt to quit: 07/26/2000    Years since quitting: 18.2  . Smokeless tobacco: Never Used  Substance and Sexual Activity  . Alcohol use: No  . Drug use: No  . Sexual activity: Not on file  Lifestyle  . Physical activity:    Days per week: Not on  file    Minutes per session: Not on file  . Stress: Not on file  Relationships  . Social connections:    Talks on phone: Not on file    Gets together: Not on file    Attends religious service: Not on file    Active member of club or organization: Not on file    Attends meetings of clubs or organizations: Not on file    Relationship status: Not on file  Other Topics Concern  . Not on file  Social History Narrative  . Not on file     Family History: The patient's ***family history includes Alzheimer's disease in her mother; Aneurysm in her mother; Colon cancer in her father and paternal aunt; Heart disease in her maternal grandmother and paternal grandmother; Stomach cancer in her paternal grandmother; Stroke in her mother; Transient ischemic attack in her father. ROS:   Please see the history of present illness.    All other systems reviewed and are negative.  EKGs/Labs/Other Studies Reviewed:    The following studies were reviewed today:  EKG:  EKG ordered today and personally reviewed.  The ekg ordered today demonstrates ***  Recent Labs: 02/16/2018: NT-Pro BNP 7,090 07/12/2018: ALT 14; TSH 7.470 08/01/2018: BUN 32; Creatinine, Ser 1.78; Hemoglobin 12.7; Platelets 213; Potassium 3.7; Sodium 144  Recent Lipid Panel No results found for: CHOL, TRIG, HDL, CHOLHDL, VLDL, LDLCALC, LDLDIRECT  Physical Exam:    VS:  There were no vitals taken for this visit.    Wt Readings from Last 3 Encounters:  08/11/18 200 lb 9.6 oz (91 kg)  08/04/18 195 lb (88.5 kg)  07/12/18 195 lb 6 oz (88.6 kg)     GEN: *** Well nourished, well developed in no acute distress HEENT: Normal NECK: No JVD; No carotid bruits LYMPHATICS: No lymphadenopathy CARDIAC: ***RRR, no murmurs, rubs, gallops RESPIRATORY:  Clear to auscultation without rales, wheezing or rhonchi  ABDOMEN: Soft, non-tender, non-distended MUSCULOSKELETAL:  No edema; No deformity  SKIN: Warm and dry NEUROLOGIC:  Alert and oriented x  3 PSYCHIATRIC:  Normal affect    Signed, Shirlee More, MD  10/16/2018 8:24 AM    [place]

## 2018-10-16 NOTE — Telephone Encounter (Signed)
Left message for patient to return call to discuss need to cancel patient's appointment with Dr. Bettina Gavia.

## 2018-11-17 DIAGNOSIS — N183 Chronic kidney disease, stage 3 (moderate): Secondary | ICD-10-CM | POA: Diagnosis not present

## 2018-11-17 DIAGNOSIS — E785 Hyperlipidemia, unspecified: Secondary | ICD-10-CM | POA: Diagnosis not present

## 2018-11-17 DIAGNOSIS — I129 Hypertensive chronic kidney disease with stage 1 through stage 4 chronic kidney disease, or unspecified chronic kidney disease: Secondary | ICD-10-CM | POA: Diagnosis not present

## 2018-11-17 DIAGNOSIS — I5032 Chronic diastolic (congestive) heart failure: Secondary | ICD-10-CM | POA: Diagnosis not present

## 2018-11-17 DIAGNOSIS — D519 Vitamin B12 deficiency anemia, unspecified: Secondary | ICD-10-CM | POA: Diagnosis not present

## 2018-11-17 DIAGNOSIS — E669 Obesity, unspecified: Secondary | ICD-10-CM | POA: Diagnosis not present

## 2018-11-17 DIAGNOSIS — D638 Anemia in other chronic diseases classified elsewhere: Secondary | ICD-10-CM | POA: Diagnosis not present

## 2018-11-17 DIAGNOSIS — Z9181 History of falling: Secondary | ICD-10-CM | POA: Diagnosis not present

## 2018-11-17 DIAGNOSIS — Z139 Encounter for screening, unspecified: Secondary | ICD-10-CM | POA: Diagnosis not present

## 2018-11-17 DIAGNOSIS — E039 Hypothyroidism, unspecified: Secondary | ICD-10-CM | POA: Diagnosis not present

## 2018-11-17 DIAGNOSIS — Z1331 Encounter for screening for depression: Secondary | ICD-10-CM | POA: Diagnosis not present

## 2018-11-17 DIAGNOSIS — I4891 Unspecified atrial fibrillation: Secondary | ICD-10-CM | POA: Diagnosis not present

## 2018-11-30 ENCOUNTER — Other Ambulatory Visit: Payer: Self-pay | Admitting: Cardiology

## 2018-11-30 NOTE — Telephone Encounter (Signed)
Rx refill sent to pharmacy. 

## 2018-12-20 ENCOUNTER — Encounter: Payer: Self-pay | Admitting: Cardiology

## 2018-12-20 ENCOUNTER — Telehealth: Payer: Self-pay | Admitting: *Deleted

## 2018-12-20 ENCOUNTER — Telehealth (INDEPENDENT_AMBULATORY_CARE_PROVIDER_SITE_OTHER): Payer: PPO | Admitting: Cardiology

## 2018-12-20 ENCOUNTER — Other Ambulatory Visit: Payer: Self-pay

## 2018-12-20 ENCOUNTER — Telehealth: Payer: Self-pay

## 2018-12-20 VITALS — BP 130/64 | HR 55 | Wt 193.0 lb

## 2018-12-20 DIAGNOSIS — E785 Hyperlipidemia, unspecified: Secondary | ICD-10-CM | POA: Diagnosis not present

## 2018-12-20 DIAGNOSIS — N189 Chronic kidney disease, unspecified: Secondary | ICD-10-CM | POA: Diagnosis not present

## 2018-12-20 DIAGNOSIS — I129 Hypertensive chronic kidney disease with stage 1 through stage 4 chronic kidney disease, or unspecified chronic kidney disease: Secondary | ICD-10-CM | POA: Diagnosis not present

## 2018-12-20 DIAGNOSIS — I480 Paroxysmal atrial fibrillation: Secondary | ICD-10-CM

## 2018-12-20 DIAGNOSIS — Z7901 Long term (current) use of anticoagulants: Secondary | ICD-10-CM | POA: Diagnosis not present

## 2018-12-20 DIAGNOSIS — E039 Hypothyroidism, unspecified: Secondary | ICD-10-CM

## 2018-12-20 DIAGNOSIS — Z79899 Other long term (current) drug therapy: Secondary | ICD-10-CM

## 2018-12-20 DIAGNOSIS — I130 Hypertensive heart and chronic kidney disease with heart failure and stage 1 through stage 4 chronic kidney disease, or unspecified chronic kidney disease: Secondary | ICD-10-CM

## 2018-12-20 MED ORDER — AMIODARONE HCL 200 MG PO TABS: 200.0000 mg | ORAL_TABLET | Freq: Every day | ORAL | 1 refills | Status: DC

## 2018-12-20 NOTE — Patient Instructions (Addendum)
Medication Instructions:  Your physician has recommended you make the following change in your medication:   DECREASE amiodarone (pacerone) 200 mg: Take 1 tablet daily  If you need a refill on your cardiac medications before your next appointment, please call your pharmacy.   Lab work: None  If you have labs (blood work) drawn today and your tests are completely normal, you will receive your results only by: Marland Kitchen MyChart Message (if you have MyChart) OR . A paper copy in the mail If you have any lab test that is abnormal or we need to change your treatment, we will call you to review the results.  Testing/Procedures: None  Follow-Up: At [facility], you and your health needs are our priority.  As part of our continuing mission to provide you with exceptional heart care, we have created designated Provider Care Teams.  These Care Teams include your primary Cardiologist (physician) and Advanced Practice Providers (APPs -  Physician Assistants and Nurse Practitioners) who all work together to provide you with the care you need, when you need it. You will need a follow up appointment in 3 months: Monday, 03/26/2019, at 3:00 in the [place] office.

## 2018-12-20 NOTE — Telephone Encounter (Signed)
Left message on pt's voice mail about Virtual Visit today, need consent.

## 2018-12-20 NOTE — Telephone Encounter (Signed)
YOUR CARDIOLOGY TEAM HAS ARRANGED FOR AN E-VISIT FOR YOUR APPOINTMENT - PLEASE REVIEW IMPORTANT INFORMATION BELOW SEVERAL DAYS PRIOR TO YOUR APPOINTMENT  Due to the recent COVID-19 pandemic, we are transitioning in-person office visits to tele-medicine visits in an effort to decrease unnecessary exposure to our patients, their families, and staff. These visits are billed to your insurance just like a normal visit is. We also encourage you to sign up for MyChart if you have not already done so. You will need a smartphone if possible. For patients that do not have this, we can still complete the visit using a regular telephone but do prefer a smartphone to enable video when possible. You may have a family member that lives with you that can help. If possible, we also ask that you have a blood pressure cuff and scale at home to measure your blood pressure, heart rate and weight prior to your scheduled appointment. Patients with clinical needs that need an in-person evaluation and testing will still be able to come to the office if absolutely necessary. If you have any questions, feel free to call our office.     Marland Kitchen YOUR PROVIDER WILL BE USING THE FOLLOWING PLATFORM TO COMPLETE YOUR VISIT: Telephone visit      2-3 DAYS BEFORE YOUR APPOINTMENT  You will receive a telephone call from one of our [place] team members - your caller ID may say "Unknown caller." If this is a video visit, we will walk you through how to get the video launched on your phone. We will remind you check your blood pressure, heart rate and weight prior to your scheduled appointment. If you have an Apple Watch or Kardia, please upload any pertinent ECG strips the day before or morning of your appointment to [place]. Our staff will also make sure you have reviewed the consent and agree to move forward with your scheduled tele-health visit.     THE DAY OF YOUR APPOINTMENT  Approximately 15 minutes prior to your scheduled  appointment, you will receive a telephone call from one of [facility] team - your caller ID may say "Unknown caller."  Our staff will confirm medications, vital signs for the day and any symptoms you may be experiencing. Please have this information available prior to the time of visit start. It may also be helpful for you to have a pad of paper and pen handy for any instructions given during your visit. They will also walk you through joining the smartphone meeting if this is a video visit.    CONSENT FOR TELE-HEALTH VISIT - PLEASE REVIEW  I hereby voluntarily request, consent and authorize [facility] and its employed or contracted physicians, physician assistants, nurse practitioners or other licensed health care professionals (the Practitioner), to provide me with telemedicine health care services (the "Services") as deemed necessary by the treating Practitioner. I acknowledge and consent to receive the Services by the Practitioner via telemedicine. I understand that the telemedicine visit will involve communicating with the Practitioner through live audiovisual communication technology and the disclosure of certain medical information by electronic transmission. I acknowledge that I have been given the opportunity to request an in-person assessment or other available alternative prior to the telemedicine visit and am voluntarily participating in the telemedicine visit.  I understand that I have the right to withhold or withdraw my consent to the use of telemedicine in the course of my care at any time, without affecting my right to future care or treatment, and that the Practitioner  or I may terminate the telemedicine visit at any time. I understand that I have the right to inspect all information obtained and/or recorded in the course of the telemedicine visit and may receive copies of available information for a reasonable fee.  I understand that some of the potential risks of receiving the Services  via telemedicine include:  Marland Kitchen Delay or interruption in medical evaluation due to technological equipment failure or disruption; . Information transmitted may not be sufficient (e.g. poor resolution of images) to allow for appropriate medical decision making by the Practitioner; and/or  . In rare instances, security protocols could fail, causing a breach of personal health information.  Furthermore, I acknowledge that it is my responsibility to provide information about my medical history, conditions and care that is complete and accurate to the best of my ability. I acknowledge that Practitioner's advice, recommendations, and/or decision may be based on factors not within their control, such as incomplete or inaccurate data provided by me or distortions of diagnostic images or specimens that may result from electronic transmissions. I understand that the practice of medicine is not an exact science and that Practitioner makes no warranties or guarantees regarding treatment outcomes. I acknowledge that I will receive a copy of this consent concurrently upon execution via email to the email address I last provided but may also request a printed copy by calling the office of [facility].    I understand that my insurance will be billed for this visit.   I have read or had this consent read to me. . I understand the contents of this consent, which adequately explains the benefits and risks of the Services being provided via telemedicine.  . I have been provided ample opportunity to ask questions regarding this consent and the Services and have had my questions answered to my satisfaction. . I give my informed consent for the services to be provided through the use of telemedicine in my medical care  By participating in this telemedicine visit I agree to the above.  Patient gave verbal consent to virtual appointment with Dr Bettina Gavia.

## 2018-12-20 NOTE — Progress Notes (Signed)
Virtual Visit via Telephone Note   This visit type was conducted due to national recommendations for restrictions regarding the COVID-19 Pandemic (e.g. social distancing) in an effort to limit this patient's exposure and mitigate transmission in our community.  Due to her co-morbid illnesses, this patient is at least at moderate risk for complications without adequate follow up.  This format is felt to be most appropriate for this patient at this time.  The patient did not have access to video technology/had technical difficulties with video requiring transitioning to audio format only (telephone).  All issues noted in this document were discussed and addressed.  No physical exam could be performed with this format.  Please refer to the patient's chart for her  consent to telehealth for [facility].   Date:  12/20/2018   ID:  Melanie Montes, DOB January 08, 1936, MRN [id number]  Patient Location: Home Provider Location: Office  PCP:  Nicoletta Dress, MD please do an amiodarone level with her next lab  Cardiologist:  Shirlee More, MD  Electrophysiologist:  None   Evaluation Performed:  Follow-Up Visit  Chief Complaint:  83 yo female presents for 2 month follow up of persistent AF after DCCV to [facility] on amiodarone.   History of Present Illness:    Melanie Montes is a 83 y.o. female with  a hx of HF, Dyslipidemia, HTN , carotid stenosis CKD and SVT/PAT  persistent AF and anticoagulated  successfully cardioverted to [facility] on amiodarone. She was last seen 08/01/18. At her last office visit Amiodarone frequency was decreased. Attempted to establish video link, but were unable to so we proceeded with telephone visit. She has had no recurrent atrial fibrillation per her report - she was previously very symptomatic while in atrial fibrillation. Endorses compliance with chronic anticoagulation with Eliquis - no bleeding complications. HR at home 55-60. Denies dizziness or lightheaded. Checks BP at home  and has been good - BP today 130/64. Labs last month with Dr. Delena Bali. She denies chest pain and shortness of breath. Endorses lower extremity edema if she stands all day, but no change from baseline.  The patient does not have symptoms concerning for COVID-19 infection (fever, chills, cough, or new shortness of breath).    Past Medical History:  Diagnosis Date   Abnormal CT scan, chest 05/02/2012   Followed in Pulmonary clinic/ [facility]/ [place]    - CT Chest 04/17/12:  Bilateral clustered nodules both upper lobes and sup segment of lower lobes      Anemia, pernicious    Breast cancer ([place])    Left   Breast cancer of upper-outer quadrant of left female breast ([place]) 01/19/2016   Carotid artery occlusion without infarction    Chest pain in adult 06/30/2015   Overview:  Overview:  Stress echo negative for ischemia at 7 mets July 2016 Overview:  Stress echo negative for ischemia at 7 mets July 2016   Chronic diastolic heart failure ([place]) 06/30/2015   CKD (chronic kidney disease) 06/30/2015   Colonic polyp    COPD (chronic obstructive pulmonary disease) ([place]) 06/02/2012   Followed in Pulmonary clinic/ [facility]/ [place]    - PFT's 06/02/2012 FEV1  1.72 (86%) ratio 68 with marked air trapping  And DLCO 67%    Cough 05/02/2012   Followed in Pulmonary clinic/ [facility]/ [place]     Degeneration of lumbar or lumbosacral intervertebral disc    Edema    GERD (gastroesophageal reflux disease)    HTN (hypertension), benign  Hyperlipidemia    Hypertensive heart and chronic kidney disease with heart failure and stage 1 through stage 4 chronic kidney disease, or unspecified chronic kidney disease ([place]) 06/30/2015   Restless legs    Past Surgical History:  Procedure Laterality Date   ABDOMINAL HYSTERECTOMY  1970   BREAST MASS EXCISION  2009   left//malignant   CARDIOVERSION N/A 03/09/2018   Procedure: CARDIOVERSION;  Surgeon: Larey Dresser, MD;  Location: [facility]  ENDOSCOPY;  Service: Cardiovascular;  Laterality: N/A;   CARDIOVERSION N/A 08/04/2018   Procedure: CARDIOVERSION;  Surgeon: Sueanne Margarita, MD;  Location: [facility] ENDOSCOPY;  Service: Cardiovascular;  Laterality: N/A;   [place]   lumbar     No outpatient medications have been marked as taking for the 12/20/18 encounter (Appointment) with Richardo Priest, MD.     Allergies:   Penicillins and Statins   Social History   Tobacco Use   Smoking status: Former Smoker    Packs/day: 1.00    Years: 20.00    Pack years: 20.00    Types: Cigarettes    Last attempt to quit: 07/26/2000    Years since quitting: 18.4   Smokeless tobacco: Never Used  Substance Use Topics   Alcohol use: No   Drug use: No     Family Hx: The patient's family history includes Alzheimer's disease in her mother; Aneurysm in her mother; Colon cancer in her father and paternal aunt; Heart disease in her maternal grandmother and paternal grandmother; Stomach cancer in her paternal grandmother; Stroke in her mother; Transient ischemic attack in her father.  ROS:   Please see the history of present illness.     All other systems reviewed and are negative.   Prior CV studies:   The following studies were reviewed today:    Labs/Other Tests and Data Reviewed:    EKG:  No ECG reviewed.  Recent Labs: 02/16/2018: NT-Pro BNP 7,090 07/12/2018: ALT 14; TSH 7.470 08/01/2018: BUN 32; Creatinine, Ser 1.78; Hemoglobin 12.7; Platelets 213; Potassium 3.7; Sodium 144   Recent Lipid Panel No results found for: CHOL, TRIG, HDL, CHOLHDL, LDLCALC, LDLDIRECT  Wt Readings from Last 3 Encounters:  08/11/18 200 lb 9.6 oz (91 kg)  08/04/18 195 lb (88.5 kg)  07/12/18 195 lb 6 oz (88.6 kg)     Objective:    Vital Signs:  There were no vitals taken for this visit.   VITAL SIGNS:  reviewed  ASSESSMENT & PLAN:    1. PAF - Denies palpitations. Reports she is in regular rhythm, previously very symptomatic in atrial  fibrillation prior to DCCV. Will continue to decrease her Amiodarone dose and have her take 1 tablet daily. Will ask her PCP to obtain amoidarone level at next office visit. She was educated to call the office if she felt her atrial fibrillation recur. 2. Chronic anticoagulation - Eliquis 5mg  BID. No bleeding complications.  3. Hypertensive heart and chronic kidney disease - BP well controlled at home. Labs carefully monitored by PCP. She reports no increase in ankle edema. Continue current diuretic therapy of Torsemide 20mg  BID and K 60mEq daily.  4. HLD - managed by PCP. Currently on Omega 3 and Niacin.  5. Hypothyroidism - managed by PCP.  COVID-19 Education: The signs and symptoms of COVID-19 were discussed with the patient and how to seek care for testing (follow up with PCP or arrange E-visit).  The importance of social distancing was discussed today.  Time:   Today, I have  spent 15 minutes with the patient with telehealth technology discussing the above problems.     Medication Adjustments/Labs and Tests Ordered: Current medicines are reviewed at length with the patient today.  Concerns regarding medicines are outlined above.   Tests Ordered: No orders of the defined types were placed in this encounter.   Medication Changes: No orders of the defined types were placed in this encounter.   Disposition:  Follow up in 3 month(s) in office.   Signed, Shirlee More, MD  12/20/2018 8:25 AM     [facility]

## 2018-12-28 ENCOUNTER — Encounter: Payer: Self-pay | Admitting: Internal Medicine

## 2019-01-15 ENCOUNTER — Other Ambulatory Visit: Payer: Self-pay | Admitting: Cardiology

## 2019-02-17 DIAGNOSIS — Z1231 Encounter for screening mammogram for malignant neoplasm of breast: Secondary | ICD-10-CM | POA: Diagnosis not present

## 2019-02-22 DIAGNOSIS — Z136 Encounter for screening for cardiovascular disorders: Secondary | ICD-10-CM | POA: Diagnosis not present

## 2019-02-22 DIAGNOSIS — Z6833 Body mass index (BMI) 33.0-33.9, adult: Secondary | ICD-10-CM | POA: Diagnosis not present

## 2019-02-22 DIAGNOSIS — D519 Vitamin B12 deficiency anemia, unspecified: Secondary | ICD-10-CM | POA: Diagnosis not present

## 2019-02-22 DIAGNOSIS — N184 Chronic kidney disease, stage 4 (severe): Secondary | ICD-10-CM | POA: Diagnosis not present

## 2019-02-22 DIAGNOSIS — E669 Obesity, unspecified: Secondary | ICD-10-CM | POA: Diagnosis not present

## 2019-02-22 DIAGNOSIS — N183 Chronic kidney disease, stage 3 (moderate): Secondary | ICD-10-CM | POA: Diagnosis not present

## 2019-02-22 DIAGNOSIS — I4891 Unspecified atrial fibrillation: Secondary | ICD-10-CM | POA: Diagnosis not present

## 2019-02-22 DIAGNOSIS — E039 Hypothyroidism, unspecified: Secondary | ICD-10-CM | POA: Diagnosis not present

## 2019-02-22 DIAGNOSIS — Z1331 Encounter for screening for depression: Secondary | ICD-10-CM | POA: Diagnosis not present

## 2019-02-22 DIAGNOSIS — E785 Hyperlipidemia, unspecified: Secondary | ICD-10-CM | POA: Diagnosis not present

## 2019-02-22 DIAGNOSIS — Z0000 Encounter for general adult medical examination without abnormal findings: Secondary | ICD-10-CM | POA: Diagnosis not present

## 2019-02-22 DIAGNOSIS — D638 Anemia in other chronic diseases classified elsewhere: Secondary | ICD-10-CM | POA: Diagnosis not present

## 2019-02-22 DIAGNOSIS — I129 Hypertensive chronic kidney disease with stage 1 through stage 4 chronic kidney disease, or unspecified chronic kidney disease: Secondary | ICD-10-CM | POA: Diagnosis not present

## 2019-02-22 DIAGNOSIS — Z9181 History of falling: Secondary | ICD-10-CM | POA: Diagnosis not present

## 2019-02-27 DIAGNOSIS — C50412 Malignant neoplasm of upper-outer quadrant of left female breast: Secondary | ICD-10-CM | POA: Diagnosis not present

## 2019-02-27 DIAGNOSIS — Z170 Estrogen receptor positive status [ER+]: Secondary | ICD-10-CM | POA: Diagnosis not present

## 2019-03-24 DIAGNOSIS — S42031A Displaced fracture of lateral end of right clavicle, initial encounter for closed fracture: Secondary | ICD-10-CM | POA: Diagnosis not present

## 2019-03-24 DIAGNOSIS — S42001A Fracture of unspecified part of right clavicle, initial encounter for closed fracture: Secondary | ICD-10-CM | POA: Diagnosis not present

## 2019-03-26 ENCOUNTER — Ambulatory Visit: Payer: PPO | Admitting: Cardiology

## 2019-03-27 DIAGNOSIS — M25511 Pain in right shoulder: Secondary | ICD-10-CM | POA: Diagnosis not present

## 2019-03-27 DIAGNOSIS — S42001A Fracture of unspecified part of right clavicle, initial encounter for closed fracture: Secondary | ICD-10-CM | POA: Diagnosis not present

## 2019-03-29 DIAGNOSIS — S72031A Displaced midcervical fracture of right femur, initial encounter for closed fracture: Secondary | ICD-10-CM | POA: Diagnosis not present

## 2019-04-06 DIAGNOSIS — S42031D Displaced fracture of lateral end of right clavicle, subsequent encounter for fracture with routine healing: Secondary | ICD-10-CM | POA: Diagnosis not present

## 2019-04-18 DIAGNOSIS — S42031D Displaced fracture of lateral end of right clavicle, subsequent encounter for fracture with routine healing: Secondary | ICD-10-CM | POA: Diagnosis not present

## 2019-05-02 DIAGNOSIS — S42031D Displaced fracture of lateral end of right clavicle, subsequent encounter for fracture with routine healing: Secondary | ICD-10-CM | POA: Diagnosis not present

## 2019-05-07 DIAGNOSIS — L239 Allergic contact dermatitis, unspecified cause: Secondary | ICD-10-CM | POA: Diagnosis not present

## 2019-05-28 DIAGNOSIS — D638 Anemia in other chronic diseases classified elsewhere: Secondary | ICD-10-CM | POA: Diagnosis not present

## 2019-05-28 DIAGNOSIS — D519 Vitamin B12 deficiency anemia, unspecified: Secondary | ICD-10-CM | POA: Diagnosis not present

## 2019-05-28 DIAGNOSIS — E785 Hyperlipidemia, unspecified: Secondary | ICD-10-CM | POA: Diagnosis not present

## 2019-05-28 DIAGNOSIS — N184 Chronic kidney disease, stage 4 (severe): Secondary | ICD-10-CM | POA: Diagnosis not present

## 2019-05-28 DIAGNOSIS — I5032 Chronic diastolic (congestive) heart failure: Secondary | ICD-10-CM | POA: Diagnosis not present

## 2019-05-28 DIAGNOSIS — M25511 Pain in right shoulder: Secondary | ICD-10-CM | POA: Diagnosis not present

## 2019-05-28 DIAGNOSIS — N183 Chronic kidney disease, stage 3 unspecified: Secondary | ICD-10-CM | POA: Diagnosis not present

## 2019-05-28 DIAGNOSIS — I4891 Unspecified atrial fibrillation: Secondary | ICD-10-CM | POA: Diagnosis not present

## 2019-05-28 DIAGNOSIS — E039 Hypothyroidism, unspecified: Secondary | ICD-10-CM | POA: Diagnosis not present

## 2019-05-28 DIAGNOSIS — I129 Hypertensive chronic kidney disease with stage 1 through stage 4 chronic kidney disease, or unspecified chronic kidney disease: Secondary | ICD-10-CM | POA: Diagnosis not present

## 2019-05-28 DIAGNOSIS — Z23 Encounter for immunization: Secondary | ICD-10-CM | POA: Diagnosis not present

## 2019-05-28 DIAGNOSIS — Z6833 Body mass index (BMI) 33.0-33.9, adult: Secondary | ICD-10-CM | POA: Diagnosis not present

## 2019-06-01 ENCOUNTER — Ambulatory Visit: Payer: PPO | Admitting: Cardiology

## 2019-06-01 NOTE — Progress Notes (Deleted)
Cardiology Office Note:    Date:  06/01/2019   ID:  Melanie Montes, DOB [birth year], MRN [id number]  PCP:  Nicoletta Dress, MD  Cardiologist:  Shirlee More, MD    Referring MD: Nicoletta Dress, MD    ASSESSMENT:    No diagnosis found. PLAN:    In order of problems listed above:  1. ***   Next appointment: ***   Medication Adjustments/Labs and Tests Ordered: Current medicines are reviewed at length with the patient today.  Concerns regarding medicines are outlined above.  No orders of the defined types were placed in this encounter.  No orders of the defined types were placed in this encounter.   No chief complaint on file.   History of Present Illness:    Melanie Montes is a 83 y.o. female with a hx of HF, Dyslipidemia, HTN , carotid stenosis CKD and SVT/PAT  persistent AF and anticoagulated  and successfully cardioverted to [facility] on amiodarone..   last seen 08/11/2018. Compliance with diet, lifestyle and medications: *** Past Medical History:  Diagnosis Date  . Abnormal CT scan, chest 05/02/2012   Followed in Pulmonary clinic/ [facility]/ [place]    - CT Chest 04/17/12:  Bilateral clustered nodules both upper lobes and sup segment of lower lobes     . Anemia, pernicious   . Breast cancer ([place])    Left  . Breast cancer of upper-outer quadrant of left female breast ([place]) 01/19/2016  . Carotid artery occlusion without infarction   . Chest pain in adult 06/30/2015   Overview:  Overview:  Stress echo negative for ischemia at 7 mets July 2016 Overview:  Stress echo negative for ischemia at 7 mets July 2016  . Chronic diastolic heart failure ([place]) 06/30/2015  . CKD (chronic kidney disease) 06/30/2015  . Colonic polyp   . COPD (chronic obstructive pulmonary disease) ([place]) 06/02/2012   Followed in Pulmonary clinic/ [facility]/ [place]    - PFT's 06/02/2012 FEV1  1.72 (86%) ratio 68 with marked air trapping  And DLCO 67%   . Cough 05/02/2012   Followed in  Pulmonary clinic/ [facility]/ [place]    . Degeneration of lumbar or lumbosacral intervertebral disc   . Edema   . GERD (gastroesophageal reflux disease)   . HTN (hypertension), benign   . Hyperlipidemia   . Hypertensive heart and chronic kidney disease with heart failure and stage 1 through stage 4 chronic kidney disease, or unspecified chronic kidney disease ([place]) 06/30/2015  . Restless legs     Past Surgical History:  Procedure Laterality Date  . ABDOMINAL HYSTERECTOMY  1970  . BREAST MASS EXCISION  2009   left//malignant  . CARDIOVERSION N/A 03/09/2018   Procedure: CARDIOVERSION;  Surgeon: Larey Dresser, MD;  Location: [facility] ENDOSCOPY;  Service: Cardiovascular;  Laterality: N/A;  . CARDIOVERSION N/A 08/04/2018   Procedure: CARDIOVERSION;  Surgeon: Sueanne Margarita, MD;  Location: [facility] ENDOSCOPY;  Service: Cardiovascular;  Laterality: N/A;  . SPINE SURGERY  1980   lumbar    Current Medications: No outpatient medications have been marked as taking for the 06/01/19 encounter (Appointment) with Richardo Priest, MD.     Allergies:   Penicillins and Statins   Social History   Socioeconomic History  . Marital status: Married    Spouse name: Not on file  . Number of children: Not on file  . Years of education: Not on file  . Highest education level: Not on file  Occupational History  .  Occupation: retired  Scientific laboratory technician  . Financial resource strain: Not on file  . Food insecurity    Worry: Not on file    Inability: Not on file  . Transportation needs    Medical: Not on file    Non-medical: Not on file  Tobacco Use  . Smoking status: Former Smoker    Packs/day: 1.00    Years: 20.00    Pack years: 20.00    Types: Cigarettes    Quit date: 07/26/2000    Years since quitting: 18.8  . Smokeless tobacco: Never Used  Substance and Sexual Activity  . Alcohol use: No  . Drug use: No  . Sexual activity: Not on file  Lifestyle  . Physical activity    Days per week: Not on  file    Minutes per session: Not on file  . Stress: Not on file  Relationships  . Social Herbalist on phone: Not on file    Gets together: Not on file    Attends religious service: Not on file    Active member of club or organization: Not on file    Attends meetings of clubs or organizations: Not on file    Relationship status: Not on file  Other Topics Concern  . Not on file  Social History Narrative  . Not on file     Family History: The patient's ***family history includes Alzheimer's disease in her mother; Aneurysm in her mother; Colon cancer in her father and paternal aunt; Heart disease in her maternal grandmother and paternal grandmother; Stomach cancer in her paternal grandmother; Stroke in her mother; Transient ischemic attack in her father. ROS:   Please see the history of present illness.    All other systems reviewed and are negative.  EKGs/Labs/Other Studies Reviewed:    The following studies were reviewed today:  EKG:  EKG ordered today and personally reviewed.  The ekg ordered today demonstrates ***  Recent Labs: 07/12/2018: ALT 14; TSH 7.470 08/01/2018: BUN 32; Creatinine, Ser 1.78; Hemoglobin 12.7; Platelets 213; Potassium 3.7; Sodium 144  Recent Lipid Panel No results found for: CHOL, TRIG, HDL, CHOLHDL, VLDL, LDLCALC, LDLDIRECT  Physical Exam:    VS:  There were no vitals taken for this visit.    Wt Readings from Last 3 Encounters:  12/20/18 193 lb (87.5 kg)  08/11/18 200 lb 9.6 oz (91 kg)  08/04/18 195 lb (88.5 kg)     GEN: *** Well nourished, well developed in no acute distress HEENT: Normal NECK: No JVD; No carotid bruits LYMPHATICS: No lymphadenopathy CARDIAC: ***RRR, no murmurs, rubs, gallops RESPIRATORY:  Clear to auscultation without rales, wheezing or rhonchi  ABDOMEN: Soft, non-tender, non-distended MUSCULOSKELETAL:  No edema; No deformity  SKIN: Warm and dry NEUROLOGIC:  Alert and oriented x 3 PSYCHIATRIC:  Normal affect     Signed, Shirlee More, MD  06/01/2019 10:37 AM    [place]

## 2019-06-04 DIAGNOSIS — S42031D Displaced fracture of lateral end of right clavicle, subsequent encounter for fracture with routine healing: Secondary | ICD-10-CM | POA: Diagnosis not present

## 2019-06-27 ENCOUNTER — Other Ambulatory Visit: Payer: Self-pay | Admitting: Cardiology

## 2019-07-23 ENCOUNTER — Other Ambulatory Visit: Payer: Self-pay | Admitting: Cardiology

## 2019-08-28 ENCOUNTER — Other Ambulatory Visit: Payer: Self-pay | Admitting: Cardiology

## 2019-08-28 DIAGNOSIS — F419 Anxiety disorder, unspecified: Secondary | ICD-10-CM | POA: Diagnosis not present

## 2019-08-28 DIAGNOSIS — N184 Chronic kidney disease, stage 4 (severe): Secondary | ICD-10-CM | POA: Diagnosis not present

## 2019-08-28 DIAGNOSIS — E039 Hypothyroidism, unspecified: Secondary | ICD-10-CM | POA: Diagnosis not present

## 2019-08-28 DIAGNOSIS — Z6833 Body mass index (BMI) 33.0-33.9, adult: Secondary | ICD-10-CM | POA: Diagnosis not present

## 2019-08-28 DIAGNOSIS — I5032 Chronic diastolic (congestive) heart failure: Secondary | ICD-10-CM | POA: Diagnosis not present

## 2019-08-28 DIAGNOSIS — D638 Anemia in other chronic diseases classified elsewhere: Secondary | ICD-10-CM | POA: Diagnosis not present

## 2019-08-28 DIAGNOSIS — N183 Chronic kidney disease, stage 3 unspecified: Secondary | ICD-10-CM | POA: Diagnosis not present

## 2019-08-28 DIAGNOSIS — I4891 Unspecified atrial fibrillation: Secondary | ICD-10-CM | POA: Diagnosis not present

## 2019-08-28 DIAGNOSIS — I129 Hypertensive chronic kidney disease with stage 1 through stage 4 chronic kidney disease, or unspecified chronic kidney disease: Secondary | ICD-10-CM | POA: Diagnosis not present

## 2019-08-28 DIAGNOSIS — R1013 Epigastric pain: Secondary | ICD-10-CM | POA: Diagnosis not present

## 2019-08-28 DIAGNOSIS — E785 Hyperlipidemia, unspecified: Secondary | ICD-10-CM | POA: Diagnosis not present

## 2019-08-28 DIAGNOSIS — D519 Vitamin B12 deficiency anemia, unspecified: Secondary | ICD-10-CM | POA: Diagnosis not present

## 2019-08-28 NOTE — Telephone Encounter (Signed)
Rx refill sent to pharmacy. 

## 2019-09-17 DIAGNOSIS — E785 Hyperlipidemia, unspecified: Secondary | ICD-10-CM | POA: Diagnosis not present

## 2019-09-17 DIAGNOSIS — L239 Allergic contact dermatitis, unspecified cause: Secondary | ICD-10-CM | POA: Diagnosis not present

## 2019-09-17 DIAGNOSIS — F419 Anxiety disorder, unspecified: Secondary | ICD-10-CM | POA: Diagnosis not present

## 2019-09-17 DIAGNOSIS — M5136 Other intervertebral disc degeneration, lumbar region: Secondary | ICD-10-CM | POA: Diagnosis not present

## 2019-09-17 NOTE — Progress Notes (Signed)
Cardiology Office Note:    Date:  09/18/2019   ID:  Melanie Montes, DOB [birth year], MRN [id number]  PCP:  Nicoletta Dress, MD  Cardiologist:  Shirlee More, MD    Referring MD: Nicoletta Dress, MD    ASSESSMENT:    1. Angina pectoris ([place])   2. Paroxysmal atrial fibrillation ([place])   3. On amiodarone therapy   4. Chronic anticoagulation   5. Chronic diastolic heart failure ([place])   6. Hypertensive heart and chronic kidney disease with heart failure and stage 1 through stage 4 chronic kidney disease, or unspecified chronic kidney disease ([place])   7. Precordial pain    PLAN:    In order of problems listed above:  1. Previously she had atypical chest pain now has a chronic pattern of stable exertional angina.  She will continue her medical regimen including anticoagulant beta-blocker calcium channel blocker and initiation of oral nitrate to try to relieve symptoms.  We will do a myocardial perfusion study for risk assessment.  I will see him back in a month make a decision if she needs further evaluation like coronary angiography and revascularization. 2. Stable atrial fibrillation maintaining sinus rhythm continue low-dose amiodarone anticoagulant. 3. Stable she is on stable thyroid replacement dosage 4. Heart failure is well compensated she has no fluid overload New York Heart Association class I and BP at target. 5. Stable CKD   Next appointment: 4 weeks   Medication Adjustments/Labs and Tests Ordered: Current medicines are reviewed at length with the patient today.  Concerns regarding medicines are outlined above.  No orders of the defined types were placed in this encounter.  No orders of the defined types were placed in this encounter.   Chief Complaint  Patient presents with  . Follow-up  . Atrial Fibrillation    History of Present Illness:    Melanie Montes is a 84 y.o. female with a hx of HF, Dyslipidemia, HTN , carotid stenosis CKD and SVT/PAT  persistent  AF and anticoagulated  successfully cardioverted to [facility] on amiodarone 08/04/2018 who was  last seen 12/20/2019. Compliance with diet, lifestyle and medications: Yes  She is seen along with her daughter.  She has had no recurrent atrial fibrillation her heart rate runs 50 to 60 bpm.  When she does vigorous activities a few times a week with physical effort she gets epigastric discomfort and short of breath and then diffuse chest tightness relieved with rest.  She is having typical exertional angina chronic pattern.  We discussed evaluation tools we will start with a myocardial perfusion study and add oral nitrates to see if we alleviate her symptoms.  If high risk findings and ongoing chest pain would benefit from coronary angiography and revascularization.  Her family physician has followed her labs most recently 08/28/2019 cholesterol 164 HDL 39 TSH 3.58 normal liver function.  Her EKG today shows sinus rhythm first-degree AV block Past Medical History:  Diagnosis Date  . Abnormal CT scan, chest 05/02/2012   Followed in Pulmonary clinic/ [facility]/ [place]    - CT Chest 04/17/12:  Bilateral clustered nodules both upper lobes and sup segment of lower lobes     . Anemia, pernicious   . Breast cancer ([place])    Left  . Breast cancer of upper-outer quadrant of left female breast ([place]) 01/19/2016  . Carotid artery occlusion without infarction   . Chest pain in adult 06/30/2015   Overview:  Overview:  Stress echo negative for ischemia at  7 mets July 2016 Overview:  Stress echo negative for ischemia at 7 mets July 2016  . Chronic diastolic heart failure ([place]) 06/30/2015  . CKD (chronic kidney disease) 06/30/2015  . Colonic polyp   . COPD (chronic obstructive pulmonary disease) ([place]) 06/02/2012   Followed in Pulmonary clinic/ [facility]/ [place]    - PFT's 06/02/2012 FEV1  1.72 (86%) ratio 68 with marked air trapping  And DLCO 67%   . Cough 05/02/2012   Followed in Pulmonary clinic/ [facility]/ [place]    . Degeneration of lumbar or lumbosacral intervertebral disc   . Edema   . GERD (gastroesophageal reflux disease)   . HTN (hypertension), benign   . Hyperlipidemia   . Hypertensive heart and chronic kidney disease with heart failure and stage 1 through stage 4 chronic kidney disease, or unspecified chronic kidney disease ([place]) 06/30/2015  . Restless legs     Past Surgical History:  Procedure Laterality Date  . ABDOMINAL HYSTERECTOMY  1970  . BREAST MASS EXCISION  2009   left//malignant  . CARDIOVERSION N/A 03/09/2018   Procedure: CARDIOVERSION;  Surgeon: Larey Dresser, MD;  Location: [facility] ENDOSCOPY;  Service: Cardiovascular;  Laterality: N/A;  . CARDIOVERSION N/A 08/04/2018   Procedure: CARDIOVERSION;  Surgeon: Sueanne Margarita, MD;  Location: [facility] ENDOSCOPY;  Service: Cardiovascular;  Laterality: N/A;  . SPINE SURGERY  1980   lumbar    Current Medications: Current Meds  Medication Sig  . Albuterol Sulfate (PROAIR RESPICLICK) 683 (90 Base) MCG/ACT AEPB Inhale 2 puffs into the lungs every 6 (six) hours as needed (shortness of breath).   . ALPRAZolam (XANAX) 0.25 MG tablet Take 0.25 mg by mouth daily as needed for anxiety.   Marland Kitchen amiodarone (PACERONE) 200 MG tablet Take 1 tablet (200 mg total) by mouth daily.  Marland Kitchen amLODipine (NORVASC) 5 MG tablet Take 5 mg by mouth daily.  . cyanocobalamin (,VITAMIN B-12,) 1000 MCG/ML injection Inject 1,000 mcg into the muscle every 30 (thirty) days.  Marland Kitchen diltiazem (TIAZAC) 240 MG 24 hr capsule Take by mouth.  Arne Cleveland 5 MG TABS tablet TAKE 1 TABLET BY MOUTH TWICE DAILY.  Marland Kitchen ezetimibe (ZETIA) 10 MG tablet Take 10 mg by mouth daily.  . famotidine (PEPCID) 40 MG tablet Take 40 mg by mouth daily.  . Glucosamine-Chondroit-Vit C-Mn (GLUCOSAMINE 1500 COMPLEX PO) Take 1,500 mg by mouth 2 (two) times daily.   Marland Kitchen HYDROcodone-acetaminophen (NORCO/VICODIN) 5-325 MG tablet   . hydrOXYzine (ATARAX/VISTARIL) 25 MG tablet   . levothyroxine (SYNTHROID) 50  MCG tablet Take 50 mcg by mouth daily.  . metoprolol succinate (TOPROL-XL) 100 MG 24 hr tablet Take 50 mg by mouth 2 (two) times daily.  . metoprolol tartrate (LOPRESSOR) 50 MG tablet Take 50 mg by mouth as directed. Take 1 tablet twice daily on Sun, Tues, Thurs, and Sat  . niacin 500 MG tablet Take 1,000 mg by mouth at bedtime.   . Omega-3 Fatty Acids (FISH OIL) 1200 MG CAPS Take 1,200 mg by mouth 2 (two) times daily.   . potassium chloride (K-DUR,KLOR-CON) 10 MEQ tablet Take 10 mEq by mouth daily.  . predniSONE (DELTASONE) 10 MG tablet   . Red Yeast Rice 600 MG TABS Take 600 mg by mouth 2 (two) times daily.   Marland Kitchen torsemide (DEMADEX) 20 MG tablet Take 1 tablet (20 mg total) by mouth 2 (two) times daily. *PLEASE SCHEDULE FOLLOW UP APPOINTMENT*  . WELCHOL 625 MG tablet Take 1,875 mg by mouth 2 (two) times daily.  . [  DISCONTINUED] potassium chloride (MICRO-K) 10 MEQ CR capsule Take 10 mEq by mouth daily.     Allergies:   Penicillins and Statins   Social History   Socioeconomic History  . Marital status: Married    Spouse name: Not on file  . Number of children: Not on file  . Years of education: Not on file  . Highest education level: Not on file  Occupational History  . Occupation: retired  Tobacco Use  . Smoking status: Former Smoker    Packs/day: 1.00    Years: 20.00    Pack years: 20.00    Types: Cigarettes    Quit date: 07/26/2000    Years since quitting: 19.1  . Smokeless tobacco: Never Used  Substance and Sexual Activity  . Alcohol use: No  . Drug use: No  . Sexual activity: Not on file  Other Topics Concern  . Not on file  Social History Narrative  . Not on file   Social Determinants of Health   Financial Resource Strain:   . Difficulty of Paying Living Expenses: Not on file  Food Insecurity:   . Worried About Charity fundraiser in the Last Year: Not on file  . Ran Out of Food in the Last Year: Not on file  Transportation Needs:   . Lack of Transportation  (Medical): Not on file  . Lack of Transportation (Non-Medical): Not on file  Physical Activity:   . Days of Exercise per Week: Not on file  . Minutes of Exercise per Session: Not on file  Stress:   . Feeling of Stress : Not on file  Social Connections:   . Frequency of Communication with Friends and Family: Not on file  . Frequency of Social Gatherings with Friends and Family: Not on file  . Attends Religious Services: Not on file  . Active Member of Clubs or Organizations: Not on file  . Attends Archivist Meetings: Not on file  . Marital Status: Not on file     Family History: The patient's family history includes Alzheimer's disease in her mother; Aneurysm in her mother; Colon cancer in her father and paternal aunt; Heart disease in her maternal grandmother and paternal grandmother; Stomach cancer in her paternal grandmother; Stroke in her mother; Transient ischemic attack in her father. ROS:   Please see the history of present illness.    All other systems reviewed and are negative.  EKGs/Labs/Other Studies Reviewed:    The following studies were reviewed today:  EKG:  EKG ordered today and personally reviewed.  The ekg ordered today demonstrates sinus rhythm first-degree AV block  Echo 02/28/2018: - Left ventricle: The cavity size was normal. Systolic function was  normal. The estimated ejection fraction was in the range of 55%  to 60%. Wall motion was normal; there were no regional wall  motion abnormalities.  - Aortic valve: There was trivial regurgitation.  Impressions:  - Normal LVEF.  Normal LA size  Mild MR.  Trace AR    Physical Exam:    VS:  BP 130/60 (BP Location: Right Arm, Patient Position: Sitting, Cuff Size: Normal)   Pulse (!) 53   Ht 5' 4.75" (1.645 m)   Wt 202 lb (91.6 kg)   SpO2 98%   BMI 33.87 kg/m     Wt Readings from Last 3 Encounters:  09/18/19 202 lb (91.6 kg)  12/20/18 193 lb (87.5 kg)  08/11/18 200 lb 9.6 oz (91  kg)     GEN:  Well nourished, well developed in no acute distress HEENT: Normal NECK: No JVD; No carotid bruits LYMPHATICS: No lymphadenopathy CARDIAC: RRR, no murmurs, rubs, gallops RESPIRATORY:  Clear to auscultation without rales, wheezing or rhonchi  ABDOMEN: Soft, non-tender, non-distended MUSCULOSKELETAL:  No edema; No deformity  SKIN: Warm and dry NEUROLOGIC:  Alert and oriented x 3 PSYCHIATRIC:  Normal affect    Signed, Shirlee More, MD  09/18/2019 12:01 PM    [facility]

## 2019-09-18 ENCOUNTER — Encounter: Payer: Self-pay | Admitting: *Deleted

## 2019-09-18 ENCOUNTER — Ambulatory Visit (INDEPENDENT_AMBULATORY_CARE_PROVIDER_SITE_OTHER): Payer: PPO | Admitting: Cardiology

## 2019-09-18 ENCOUNTER — Other Ambulatory Visit: Payer: Self-pay

## 2019-09-18 ENCOUNTER — Encounter: Payer: Self-pay | Admitting: Cardiology

## 2019-09-18 VITALS — BP 130/60 | HR 53 | Ht 64.75 in | Wt 202.0 lb

## 2019-09-18 DIAGNOSIS — R072 Precordial pain: Secondary | ICD-10-CM

## 2019-09-18 DIAGNOSIS — I209 Angina pectoris, unspecified: Secondary | ICD-10-CM | POA: Diagnosis not present

## 2019-09-18 DIAGNOSIS — I480 Paroxysmal atrial fibrillation: Secondary | ICD-10-CM

## 2019-09-18 DIAGNOSIS — Z79899 Other long term (current) drug therapy: Secondary | ICD-10-CM | POA: Diagnosis not present

## 2019-09-18 DIAGNOSIS — I130 Hypertensive heart and chronic kidney disease with heart failure and stage 1 through stage 4 chronic kidney disease, or unspecified chronic kidney disease: Secondary | ICD-10-CM

## 2019-09-18 DIAGNOSIS — Z7901 Long term (current) use of anticoagulants: Secondary | ICD-10-CM

## 2019-09-18 DIAGNOSIS — I5032 Chronic diastolic (congestive) heart failure: Secondary | ICD-10-CM | POA: Diagnosis not present

## 2019-09-18 MED ORDER — ISOSORBIDE MONONITRATE ER 30 MG PO TB24: 30.0000 mg | ORAL_TABLET | Freq: Every day | ORAL | 1 refills | Status: DC

## 2019-09-18 MED ORDER — NITROGLYCERIN 0.4 MG SL SUBL: 0.4000 mg | SUBLINGUAL_TABLET | SUBLINGUAL | 3 refills | Status: DC | PRN

## 2019-09-18 NOTE — Patient Instructions (Signed)
Medication Instructions:  Your physician has recommended you make the following change in your medication:   START: Imdur 30 mg Take 1 tab daily  Nitroglycerin 0.4 mg sublingual (under your tongue) as needed for chest pain. If experiencing chest pain, stop what you are doing and sit down. Take 1 nitroglycerin and wait 5 minutes. If chest pain continues, take another nitroglycerin and wait 5 minutes. If chest pain does not subside, take 1 more nitroglycerin and dial 911. You make take a total of 3 nitroglycerin in a 15 minute time frame.   *If you need a refill on your cardiac medications before your next appointment, please call your pharmacy*  Lab Work: None If you have labs (blood work) drawn today and your tests are completely normal, you will receive your results only by: Marland Kitchen MyChart Message (if you have MyChart) OR . A paper copy in the mail If you have any lab test that is abnormal or we need to change your treatment, we will call you to review the results.  Testing/Procedures: Your physician has requested that you have a lexiscan myoview. For further information please visit [email or web address]. Please follow instruction sheet, as given.   Follow-Up: At [facility], you and your health needs are our priority.  As part of our continuing mission to provide you with exceptional heart care, we have created designated Provider Care Teams.  These Care Teams include your primary Cardiologist (physician) and Advanced Practice Providers (APPs -  Physician Assistants and Nurse Practitioners) who all work together to provide you with the care you need, when you need it.  Your next appointment:   4 week(s)  The format for your next appointment:   In Person  Provider:   Shirlee More, MD  Other Instructions

## 2019-09-19 ENCOUNTER — Telehealth (HOSPITAL_COMMUNITY): Payer: Self-pay | Admitting: *Deleted

## 2019-09-19 NOTE — Telephone Encounter (Signed)
Patient given detailed instructions per Myocardial Perfusion Study Information Sheet for the test on 09/26/19. Patient notified to arrive 15 minutes early and that it is imperative to arrive on time for appointment to keep from having the test rescheduled.  If you need to cancel or reschedule your appointment, please call the office within 24 hours of your appointment. . Patient verbalized understanding. Kirstie Peri

## 2019-09-26 ENCOUNTER — Ambulatory Visit (INDEPENDENT_AMBULATORY_CARE_PROVIDER_SITE_OTHER): Payer: PPO

## 2019-09-26 ENCOUNTER — Other Ambulatory Visit: Payer: Self-pay

## 2019-09-26 DIAGNOSIS — R072 Precordial pain: Secondary | ICD-10-CM

## 2019-09-26 LAB — MYOCARDIAL PERFUSION IMAGING
LV dias vol: 72 mL (ref 46–106)
LV sys vol: 24 mL
Peak HR: 69 {beats}/min
Rest HR: 60 {beats}/min
SDS: 3
SRS: 5
SSS: 8
TID: 0.97

## 2019-09-26 MED ORDER — REGADENOSON 0.4 MG/5ML IV SOLN
0.4000 mg | Freq: Once | INTRAVENOUS | Status: AC
  Administered 2019-09-26: 0.4 mg via INTRAVENOUS

## 2019-09-26 MED ORDER — TECHNETIUM TC 99M TETROFOSMIN IV KIT
10.1000 | PACK | Freq: Once | INTRAVENOUS | Status: AC | PRN
  Administered 2019-09-26: 10.1 via INTRAVENOUS

## 2019-09-26 MED ORDER — TECHNETIUM TC 99M TETROFOSMIN IV KIT
30.1000 | PACK | Freq: Once | INTRAVENOUS | Status: AC | PRN
  Administered 2019-09-26: 30.1 via INTRAVENOUS

## 2019-10-10 ENCOUNTER — Other Ambulatory Visit: Payer: Self-pay | Admitting: Cardiology

## 2019-10-19 ENCOUNTER — Other Ambulatory Visit: Payer: Self-pay

## 2019-10-19 ENCOUNTER — Ambulatory Visit (INDEPENDENT_AMBULATORY_CARE_PROVIDER_SITE_OTHER): Payer: PPO | Admitting: Cardiology

## 2019-10-19 VITALS — BP 132/58 | HR 58 | Ht 64.5 in | Wt 205.0 lb

## 2019-10-19 DIAGNOSIS — R079 Chest pain, unspecified: Secondary | ICD-10-CM

## 2019-10-19 DIAGNOSIS — R0600 Dyspnea, unspecified: Secondary | ICD-10-CM

## 2019-10-19 DIAGNOSIS — I130 Hypertensive heart and chronic kidney disease with heart failure and stage 1 through stage 4 chronic kidney disease, or unspecified chronic kidney disease: Secondary | ICD-10-CM

## 2019-10-19 DIAGNOSIS — Z7901 Long term (current) use of anticoagulants: Secondary | ICD-10-CM

## 2019-10-19 DIAGNOSIS — Z79899 Other long term (current) drug therapy: Secondary | ICD-10-CM

## 2019-10-19 DIAGNOSIS — I480 Paroxysmal atrial fibrillation: Secondary | ICD-10-CM

## 2019-10-19 DIAGNOSIS — R0609 Other forms of dyspnea: Secondary | ICD-10-CM

## 2019-10-19 NOTE — Patient Instructions (Signed)
Medication Instructions:  Your physician recommends that you continue on your current medications as directed. Please refer to the Current Medication list given to you today.  *If you need a refill on your cardiac medications before your next appointment, please call your pharmacy*   Lab Work: None If you have labs (blood work) drawn today and your tests are completely normal, you will receive your results only by: Marland Kitchen MyChart Message (if you have MyChart) OR . A paper copy in the mail If you have any lab test that is abnormal or we need to change your treatment, we will call you to review the results.   Testing/Procedures: We have ordered for you to have a chest-xray at [facility]. Please head over to their facility and have this done as soon as possible.   Follow-Up: At [facility], you and your health needs are our priority.  As part of our continuing mission to provide you with exceptional heart care, we have created designated Provider Care Teams.  These Care Teams include your primary Cardiologist (physician) and Advanced Practice Providers (APPs -  Physician Assistants and Nurse Practitioners) who all work together to provide you with the care you need, when you need it.  We recommend signing up for the patient portal called "MyChart".  Sign up information is provided on this After Visit Summary.  MyChart is used to connect with patients for Virtual Visits (Telemedicine).  Patients are able to view lab/test results, encounter notes, upcoming appointments, etc.  Non-urgent messages can be sent to your provider as well.   To learn more about what you can do with MyChart, go to [email or web address].    Your next appointment:   3 month(s)  The format for your next appointment:   In Person  Provider:   Shirlee More, MD   Other Instructions

## 2019-10-19 NOTE — Progress Notes (Signed)
Cardiology Office Note:    Date:  10/19/2019   ID:  Melanie Montes, DOB July 23, 1936, MRN [id number]  PCP:  Nicoletta Dress, MD  Cardiologist:  Shirlee More, MD    Referring MD: Nicoletta Dress, MD    ASSESSMENT:    1. Paroxysmal atrial fibrillation (HCC)   2. On amiodarone therapy   3. Chronic anticoagulation   4. Hypertensive heart and chronic kidney disease with heart failure and stage 1 through stage 4 chronic kidney disease, or unspecified chronic kidney disease (HCC)   5. Chest pain in adult   6. Dyspnea on exertion    PLAN:    In order of problems listed above:  1. Attaining sinus rhythm continue amiodarone beta-blocker and anticoagulant. 2. Blood pressure target continue current treatment CKD stable followed by her PCP 3. Hyperlipidemia stable lipids at target 4. Chest pain stable no evidence of ischemia I would not refer for coronary angiography continue on oral nitrates of been quite effective in relieving her chest pain 5. Shortness of breath check chest x-ray on amiodarone   Next appointment: 3 months   Medication Adjustments/Labs and Tests Ordered: Current medicines are reviewed at length with the patient today.  Concerns regarding medicines are outlined above.  No orders of the defined types were placed in this encounter.  No orders of the defined types were placed in this encounter.   Chief Complaint  Patient presents with  . Follow-up  . Atrial Fibrillation    History of Present Illness:    Melanie Montes is a 84 y.o. female with a hx of  HF, Dyslipidemia, HTN , carotid stenosis CKD and SVT/PAT  persistent AF and anticoagulated  successfully cardioverted to [facility] on amiodarone last seen 09/18/2019 for chest pain. . Compliance with diet, lifestyle and medications: Yes  Fortunately with medical therapy she has had no further anginal discomfort.  She is short of breath with more than usual activity attributed to being sedentary and body weight.   She does not have cough wheezing no edema orthopnea.  Asked her to walk every day at [facility] she uses a cart for support and her daughter will address the weight issue for completeness we will do a chest x-ray on amiodarone.  I personally reviewed her myocardial perfusion imaging 09/26/2019 ejection fraction 67% attenuation no ischemia and normal LV function.  I personally reviewed the EKG from the day of the stress test 09/26/2019 showing sinus rhythm incomplete right bundle branch block. Past Medical History:  Diagnosis Date  . Abnormal CT scan, chest 05/02/2012   Followed in Pulmonary clinic/ [facility]/ [place]    - CT Chest 04/17/12:  Bilateral clustered nodules both upper lobes and sup segment of lower lobes     . Anemia, pernicious   . Breast cancer ([place])    Left  . Breast cancer of upper-outer quadrant of left female breast ([place]) 01/19/2016  . Carotid artery occlusion without infarction   . Chest pain in adult 06/30/2015   Overview:  Overview:  Stress echo negative for ischemia at 7 mets July 2016 Overview:  Stress echo negative for ischemia at 7 mets July 2016  . Chronic diastolic heart failure ([place]) 06/30/2015  . CKD (chronic kidney disease) 06/30/2015  . Colonic polyp   . COPD (chronic obstructive pulmonary disease) ([place]) 06/02/2012   Followed in Pulmonary clinic/ [facility]/ [place]    - PFT's 06/02/2012 FEV1  1.72 (86%) ratio 68 with marked air trapping  And DLCO 67%   .  Cough 05/02/2012   Followed in Pulmonary clinic/ [facility]/ [place]    . Degeneration of lumbar or lumbosacral intervertebral disc   . Edema   . GERD (gastroesophageal reflux disease)   . HTN (hypertension), benign   . Hyperlipidemia   . Hypertensive heart and chronic kidney disease with heart failure and stage 1 through stage 4 chronic kidney disease, or unspecified chronic kidney disease ([place]) 06/30/2015  . Restless legs     Past Surgical History:  Procedure Laterality Date  . ABDOMINAL  HYSTERECTOMY  1970  . BREAST MASS EXCISION  2009   left//malignant  . CARDIOVERSION N/A 03/09/2018   Procedure: CARDIOVERSION;  Surgeon: Larey Dresser, MD;  Location[facility] ENDOSCOPY;  Service: Cardiovascular;  Laterality: N/A;  . CARDIOVERSION N/A 08/04/2018   Procedure: CARDIOVERSION;  Surgeon: Sueanne Margarita, MD;  Location: [facility] ENDOSCOPY;  Service: Cardiovascular;  Laterality: N/A;  . SPINE SURGERY  1980   lumbar    Current Medications: Current Meds  Medication Sig  . Albuterol Sulfate (PROAIR RESPICLICK) 254 (90 Base) MCG/ACT AEPB Inhale 2 puffs into the lungs every 6 (six) hours as needed (shortness of breath).   . ALPRAZolam (XANAX) 0.25 MG tablet Take 0.25 mg by mouth daily as needed for anxiety.   Marland Kitchen amiodarone (PACERONE) 200 MG tablet Take 1 tablet (200 mg total) by mouth daily.  Marland Kitchen amLODipine (NORVASC) 5 MG tablet Take 5 mg by mouth daily.  . cyanocobalamin (,VITAMIN B-12,) 1000 MCG/ML injection Inject 1,000 mcg into the muscle every 30 (thirty) days.  Marland Kitchen diltiazem (TIAZAC) 240 MG 24 hr capsule Take by mouth.  Arne Cleveland 5 MG TABS tablet TAKE 1 TABLET BY MOUTH TWICE DAILY  . ezetimibe (ZETIA) 10 MG tablet Take 10 mg by mouth daily.  . famotidine (PEPCID) 40 MG tablet Take 40 mg by mouth daily.  . Glucosamine-Chondroit-Vit C-Mn (GLUCOSAMINE 1500 COMPLEX PO) Take 1,500 mg by mouth 2 (two) times daily.   Marland Kitchen HYDROcodone-acetaminophen (NORCO/VICODIN) 5-325 MG tablet   . hydrOXYzine (ATARAX/VISTARIL) 25 MG tablet   . isosorbide mononitrate (IMDUR) 30 MG 24 hr tablet Take 1 tablet (30 mg total) by mouth daily.  Marland Kitchen levothyroxine (SYNTHROID) 50 MCG tablet Take 50 mcg by mouth daily.  . metoprolol succinate (TOPROL-XL) 100 MG 24 hr tablet Take 50 mg by mouth 2 (two) times daily.  . niacin 500 MG tablet Take 1,000 mg by mouth at bedtime.   . nitroGLYCERIN (NITROSTAT) 0.4 MG SL tablet Place 1 tablet (0.4 mg total) under the tongue every 5 (five) minutes as needed.  . Omega-3 Fatty Acids (FISH  OIL) 1200 MG CAPS Take 1,200 mg by mouth 2 (two) times daily.   . potassium chloride (K-DUR,KLOR-CON) 10 MEQ tablet Take 10 mEq by mouth daily.  . predniSONE (DELTASONE) 10 MG tablet   . Red Yeast Rice 600 MG TABS Take 600 mg by mouth 2 (two) times daily.   Marland Kitchen torsemide (DEMADEX) 20 MG tablet TAKE 1 TABLET BY MOUTH 2 TIMES DAILY.  Earnestine Mealing 625 MG tablet Take 1,875 mg by mouth 2 (two) times daily.  . [DISCONTINUED] metoprolol tartrate (LOPRESSOR) 50 MG tablet Take 50 mg by mouth as directed. Take 1 tablet twice daily on Sun, Tues, Thurs, and Sat     Allergies:   Penicillins and Statins   Social History   Socioeconomic History  . Marital status: Married    Spouse name: Not on file  . Number of children: Not on file  . Years of education:  Not on file  . Highest education level: Not on file  Occupational History  . Occupation: retired  Tobacco Use  . Smoking status: Former Smoker    Packs/day: 1.00    Years: 20.00    Pack years: 20.00    Types: Cigarettes    Quit date: 07/26/2000    Years since quitting: 19.2  . Smokeless tobacco: Never Used  Substance and Sexual Activity  . Alcohol use: No  . Drug use: No  . Sexual activity: Not on file  Other Topics Concern  . Not on file  Social History Narrative  . Not on file   Social Determinants of Health   Financial Resource Strain:   . Difficulty of Paying Living Expenses:   Food Insecurity:   . Worried About Charity fundraiser in the Last Year:   . Arboriculturist in the Last Year:   Transportation Needs:   . Film/video editor (Medical):   Marland Kitchen Lack of Transportation (Non-Medical):   Physical Activity:   . Days of Exercise per Week:   . Minutes of Exercise per Session:   Stress:   . Feeling of Stress :   Social Connections:   . Frequency of Communication with Friends and Family:   . Frequency of Social Gatherings with Friends and Family:   . Attends Religious Services:   . Active Member of Clubs or Organizations:   .  Attends Archivist Meetings:   Marland Kitchen Marital Status:      Family History: The patient's family history includes Alzheimer's disease in her mother; Aneurysm in her mother; Colon cancer in her father and paternal aunt; Heart disease in her maternal grandmother and paternal grandmother; Stomach cancer in her paternal grandmother; Stroke in her mother; Transient ischemic attack in her father. ROS:   Please see the history of present illness.    All other systems reviewed and are negative.  EKGs/Labs/Other Studies Reviewed:    The following studies were reviewed today:    Recent Labs: 08/28/2019 cholesterol 164 LDL at target 96 HDL 39 creatinine 1.89 TSH normal  Physical Exam:    VS:  BP (!) 132/58   Pulse (!) 58   Ht 5' 4.5" (1.638 m)   Wt 205 lb (93 kg)   SpO2 98%   BMI 34.64 kg/m     Wt Readings from Last 3 Encounters:  10/19/19 205 lb (93 kg)  09/26/19 202 lb (91.6 kg)  09/18/19 202 lb (91.6 kg)     GEN:  Well nourished, well developed in no acute distress HEENT: Normal NECK: No JVD; No carotid bruits LYMPHATICS: No lymphadenopathy CARDIAC: RRR, no murmurs, rubs, gallops RESPIRATORY:  Clear to auscultation without rales, wheezing or rhonchi  ABDOMEN: Soft, non-tender, non-distended MUSCULOSKELETAL:  No edema; No deformity  SKIN: Warm and dry NEUROLOGIC:  Alert and oriented x 3 PSYCHIATRIC:  Normal affect    Signed, Shirlee More, MD  10/19/2019 2:00 PM    [place]

## 2019-10-19 NOTE — Addendum Note (Signed)
Addended by: Resa Miner I on: 10/19/2019 02:06 PM   Modules accepted: Orders

## 2019-11-09 ENCOUNTER — Encounter: Payer: Self-pay | Admitting: Cardiology

## 2019-11-09 DIAGNOSIS — I700 Atherosclerosis of aorta: Secondary | ICD-10-CM | POA: Diagnosis not present

## 2019-11-09 DIAGNOSIS — I2510 Atherosclerotic heart disease of native coronary artery without angina pectoris: Secondary | ICD-10-CM | POA: Diagnosis not present

## 2019-11-09 DIAGNOSIS — R0602 Shortness of breath: Secondary | ICD-10-CM | POA: Diagnosis not present

## 2019-11-27 DIAGNOSIS — E039 Hypothyroidism, unspecified: Secondary | ICD-10-CM | POA: Diagnosis not present

## 2019-11-27 DIAGNOSIS — D638 Anemia in other chronic diseases classified elsewhere: Secondary | ICD-10-CM | POA: Diagnosis not present

## 2019-11-27 DIAGNOSIS — M5136 Other intervertebral disc degeneration, lumbar region: Secondary | ICD-10-CM | POA: Diagnosis not present

## 2019-11-27 DIAGNOSIS — I4891 Unspecified atrial fibrillation: Secondary | ICD-10-CM | POA: Diagnosis not present

## 2019-11-27 DIAGNOSIS — I129 Hypertensive chronic kidney disease with stage 1 through stage 4 chronic kidney disease, or unspecified chronic kidney disease: Secondary | ICD-10-CM | POA: Diagnosis not present

## 2019-11-27 DIAGNOSIS — N184 Chronic kidney disease, stage 4 (severe): Secondary | ICD-10-CM | POA: Diagnosis not present

## 2019-11-27 DIAGNOSIS — E785 Hyperlipidemia, unspecified: Secondary | ICD-10-CM | POA: Diagnosis not present

## 2019-11-27 DIAGNOSIS — N183 Chronic kidney disease, stage 3 unspecified: Secondary | ICD-10-CM | POA: Diagnosis not present

## 2019-11-27 DIAGNOSIS — I5032 Chronic diastolic (congestive) heart failure: Secondary | ICD-10-CM | POA: Diagnosis not present

## 2019-11-27 DIAGNOSIS — Z800 Family history of malignant neoplasm of digestive organs: Secondary | ICD-10-CM | POA: Diagnosis not present

## 2019-11-27 DIAGNOSIS — D519 Vitamin B12 deficiency anemia, unspecified: Secondary | ICD-10-CM | POA: Diagnosis not present

## 2019-11-27 DIAGNOSIS — Z139 Encounter for screening, unspecified: Secondary | ICD-10-CM | POA: Diagnosis not present

## 2019-11-29 DIAGNOSIS — D519 Vitamin B12 deficiency anemia, unspecified: Secondary | ICD-10-CM | POA: Diagnosis not present

## 2019-12-06 DIAGNOSIS — D519 Vitamin B12 deficiency anemia, unspecified: Secondary | ICD-10-CM | POA: Diagnosis not present

## 2019-12-13 DIAGNOSIS — D519 Vitamin B12 deficiency anemia, unspecified: Secondary | ICD-10-CM | POA: Diagnosis not present

## 2019-12-20 DIAGNOSIS — D519 Vitamin B12 deficiency anemia, unspecified: Secondary | ICD-10-CM | POA: Diagnosis not present

## 2019-12-25 DIAGNOSIS — L03116 Cellulitis of left lower limb: Secondary | ICD-10-CM | POA: Diagnosis not present

## 2020-01-16 ENCOUNTER — Other Ambulatory Visit: Payer: Self-pay | Admitting: Cardiology

## 2020-01-18 ENCOUNTER — Ambulatory Visit: Payer: PPO | Admitting: Cardiology

## 2020-01-21 DIAGNOSIS — D519 Vitamin B12 deficiency anemia, unspecified: Secondary | ICD-10-CM | POA: Diagnosis not present

## 2020-01-23 ENCOUNTER — Encounter: Payer: Self-pay | Admitting: Internal Medicine

## 2020-01-23 ENCOUNTER — Telehealth: Payer: Self-pay

## 2020-01-23 ENCOUNTER — Ambulatory Visit (INDEPENDENT_AMBULATORY_CARE_PROVIDER_SITE_OTHER): Payer: PPO | Admitting: Internal Medicine

## 2020-01-23 VITALS — BP 154/76 | HR 86 | Ht 64.0 in | Wt 205.0 lb

## 2020-01-23 DIAGNOSIS — Z7901 Long term (current) use of anticoagulants: Secondary | ICD-10-CM

## 2020-01-23 DIAGNOSIS — I480 Paroxysmal atrial fibrillation: Secondary | ICD-10-CM

## 2020-01-23 DIAGNOSIS — Z860100 Personal history of colon polyps, unspecified: Secondary | ICD-10-CM

## 2020-01-23 DIAGNOSIS — Z800 Family history of malignant neoplasm of digestive organs: Secondary | ICD-10-CM

## 2020-01-23 MED ORDER — SUTAB 1479-225-188 MG PO TABS: 1.0000 | ORAL_TABLET | Freq: Once | ORAL | 0 refills | Status: AC

## 2020-01-23 NOTE — Progress Notes (Signed)
HISTORY OF PRESENT ILLNESS:  Melanie Montes is a 84 y.o. female with multiple medical problems including chronic renal insufficiency, COPD, paroxysmal atrial fibrillation status post cardioversion therapy x 2, now on amiodarone, metoprolol, and chronic Eliquis.  Patient was last evaluated by her cardiologist, Dr. Bettina Gavia, in March.  She was in sinus rhythm at that time.  Patient has not had prior stroke.  She is sent today by her primary care provider Dr. Olean Ree regarding the need for colonoscopy.  The patient has a history of colon cancer in her father as well as maternal aunt.  She has undergone multiple prior colonoscopies dating back to 1998.  Most recent examination June 2015 with 4 adenomatous colon polyps.  As well moderate diverticulosis.  Routine follow-up in 5 years recommended.  She missed her routine surveillance due to Covid.  She is pleased to report that she is feeling well.  Doing well from a cardiac standpoint.  No GI complaints.  GI review of systems is negative.  She is motivated to have colonoscopy.  She continues to be quite active and looks well.  Review of outside laboratories shows creatinine 1.85 with GFR 25 she has completed her Covid vaccination series  REVIEW OF SYSTEMS:  All non-GI ROS negative unless otherwise stated in the HPI.  Past Medical History:  Diagnosis Date  . Abnormal CT scan, chest 05/02/2012   Followed in Pulmonary clinic/ [facility]/ [place]    - CT Chest 04/17/12:  Bilateral clustered nodules both upper lobes and sup segment of lower lobes     . Anemia, pernicious   . Breast cancer ([place])    Left  . Breast cancer of upper-outer quadrant of left female breast ([place]) 01/19/2016  . Carotid artery occlusion without infarction   . Carotid stenosis 05/17/2018  . Chest pain in adult 06/30/2015   Overview:  Overview:  Stress echo negative for ischemia at 7 mets July 2016 Overview:  Stress echo negative for ischemia at 7 mets July 2016  . Chronic diastolic heart  failure ([place]) 06/30/2015  . CKD (chronic kidney disease) 06/30/2015  . Colonic polyp   . COPD (chronic obstructive pulmonary disease) ([place]) 06/02/2012   Followed in Pulmonary clinic/ [facility]/ [place]    - PFT's 06/02/2012 FEV1  1.72 (86%) ratio 68 with marked air trapping  And DLCO 67%   . Cough 05/02/2012   Followed in Pulmonary clinic/ [facility]/ [place]    . Degeneration of lumbar or lumbosacral intervertebral disc   . Edema   . Fibrocystic breast changes, bilateral 01/23/2018  . GERD (gastroesophageal reflux disease)   . HTN (hypertension), benign   . Hyperlipidemia   . Hypertensive heart and chronic kidney disease with heart failure and stage 1 through stage 4 chronic kidney disease, or unspecified chronic kidney disease ([place]) 06/30/2015  . Hypothyroidism   . Paroxysmal atrial fibrillation (HCC)   . PAT (paroxysmal atrial tachycardia) ([place]) 12/13/2017  . Restless legs     Past Surgical History:  Procedure Laterality Date  . ABDOMINAL HYSTERECTOMY  1970  . BREAST MASS EXCISION  2009   left//malignant  . CARDIOVERSION N/A 03/09/2018   Procedure: CARDIOVERSION;  Surgeon: Larey Dresser, MD;  Location: [facility] ENDOSCOPY;  Service: Cardiovascular;  Laterality: N/A;  . CARDIOVERSION N/A 08/04/2018   Procedure: CARDIOVERSION;  Surgeon: Sueanne Margarita, MD;  Location: [facility] ENDOSCOPY;  Service: Cardiovascular;  Laterality: N/A;  . [place]   lumbar    Social History Loran Senters  reports that she quit smoking about 19 years ago. Her smoking use included cigarettes. She has a 20.00 pack-year smoking history. She has never used smokeless tobacco. She reports that she does not drink alcohol and does not use drugs.  family history includes Alzheimer's disease in her mother; Aneurysm in her mother; Colon cancer in her father and paternal aunt; Heart disease in her maternal grandmother and paternal grandmother; Stomach cancer in her paternal grandmother; Stroke in her mother;  Transient ischemic attack in her father.  Allergies  Allergen Reactions  . Penicillins Shortness Of Breath    Has patient had a PCN reaction causing immediate rash, facial/tongue/throat swelling, SOB or lightheadedness with hypotension: No Has patient had a PCN reaction causing severe rash involving mucus membranes or skin necrosis: No Has patient had a PCN reaction that required hospitalization: No Has patient had a PCN reaction occurring within the last 10 years: No If all of the above answers are "NO", then may proceed with Cephalosporin use.    . Statins Other (See Comments)    myalgias       PHYSICAL EXAMINATION: Vital signs: BP (!) 154/76 Comment: irregular  Pulse 86   Ht 5\' 4"  (1.626 m)   Wt 205 lb (93 kg)   SpO2 97%   BMI 35.19 kg/m   Constitutional: generally well-appearing, no acute distress Psychiatric: alert and oriented x3, cooperative Eyes: extraocular movements intact, anicteric, conjunctiva pink Mouth: oral pharynx moist, no lesions Neck: supple no lymphadenopathy Cardiovascular: Irregularly irregular with controlled rate, no murmur Lungs: clear to auscultation bilaterally Abdomen: soft, nontender, nondistended, no obvious ascites, no peritoneal signs, normal bowel sounds, no organomegaly Rectal: Deferred until colonoscopy Extremities: no clubbing or cyanosis.  Trace lower extremity edema bilaterally Skin: no lesions on visible extremities Neuro: No focal deficits.  Cranial nerves intact  ASSESSMENT:  1.  Strong family history of colon cancer.  Due for surveillance 2.  Personal history of multiple adenomatous colon polyps.  Due for surveillance 3.  Medical problems.  Under control 4.  History of atrial fibrillation on chronic Eliquis.  Appears to be in atrial fibrillation today, but asymptomatic and rate controlled.   PLAN:  1.  Colonoscopy.  Patient is HIGH RISK given her age, comorbidities and history of atrial fibrillation and chronic  anticoagulation state.The nature of the procedure, as well as the risks, benefits, and alternatives were carefully and thoroughly reviewed with the patient. Ample time for discussion and questions allowed. The patient understood, was satisfied, and agreed to proceed.  The procedure will be performed with monitored anesthesia care 2.  We will hold Eliquis 3 days prior to her procedure (has renal insufficiency) if okay with her cardiologist Dr. Bettina Gavia.  We will reach out... 3.  Continue famotidine for upper GI mucosal protection.

## 2020-01-23 NOTE — Telephone Encounter (Signed)
Primary Cardiologist:Brian Bettina Gavia, MD  Chart reviewed as part of pre-operative protocol coverage. Because of Melanie Montes's past medical history and time since last visit, he/she will require a follow-up visit in order to better assess preoperative cardiovascular risk.  Pre-op covering staff: - Please schedule appointment and call patient to inform them. - Please contact requesting surgeon's office via preferred method (i.e, phone, fax) to inform them of need for appointment prior to surgery.  If applicable, this message will also be routed to pharmacy pool and/or primary cardiologist for input on holding anticoagulant/antiplatelet agent as requested below so that this information is available at time of patient's appointment.   Deberah Pelton, NP  01/23/2020, 2:30 PM

## 2020-01-23 NOTE — Telephone Encounter (Signed)
this to the preop pool

## 2020-01-23 NOTE — Telephone Encounter (Signed)
[facility] Pre-operative Risk Assessment     Request for surgical clearance:     Endoscopy Procedure  What type of surgery is being performed?     Colonsocopy  When is this surgery scheduled?     03/27/2020  What type of clearance is required ?   Pharmacy  Are there any medications that need to be held prior to surgery and how long? Eliquis - 3 days  Practice name and name of physician performing surgery?      [facility]  What is your office phone and fax number?      Phone- [phone]  Fax226 [phone]  Anesthesia type (None, local, MAC, general) ?       MAC

## 2020-01-23 NOTE — Telephone Encounter (Signed)
Pt currently has appointment scheduled OFFICE VISIT 02/25/2020 @ 11:00 AM w/Brian Bettina Gavia, MD. Pre-op clearance will be done at that time will add to wait list for sooner appt. Will forward to requesting party for notification.

## 2020-01-23 NOTE — Telephone Encounter (Signed)
Patient with diagnosis of afib on Eliquis for anticoagulation.    Procedure:  Colonsocopy Date of procedure: 03/27/2020  CHADS2-VASc score of  5 (CHF, HTN, AGE, AGE, female)  CrCl 26 ml/min  Per office protocol, patient can hold Eliquis for 3 days prior to procedure.

## 2020-01-23 NOTE — Telephone Encounter (Signed)
Left message for pt to call back to discuss cardiac clearance

## 2020-01-23 NOTE — Patient Instructions (Signed)
If you are age 82 or older, your body mass index should be between 23-30. Your Body mass index is 35.19 kg/m. If this is out of the aforementioned range listed, please consider follow up with your Primary Care Provider.  If you are age 58 or younger, your body mass index should be between 19-25. Your Body mass index is 35.19 kg/m. If this is out of the aformentioned range listed, please consider follow up with your Primary Care Provider.    You have been scheduled for a colonoscopy. Please follow written instructions given to you at your visit today.  Please pick up your prep supplies at the pharmacy within the next 1-3 days. If you use inhalers (even only as needed), please bring them with you on the day of your procedure.

## 2020-01-24 NOTE — Telephone Encounter (Signed)
Called pt and discuss pre-op clearance and appt scheduled 02-25-20@11am . She states that she cannot drive out of [place] and come to [place]. She will keep this appt for cardiac clearance

## 2020-02-21 DIAGNOSIS — N184 Chronic kidney disease, stage 4 (severe): Secondary | ICD-10-CM | POA: Diagnosis not present

## 2020-02-21 DIAGNOSIS — I129 Hypertensive chronic kidney disease with stage 1 through stage 4 chronic kidney disease, or unspecified chronic kidney disease: Secondary | ICD-10-CM | POA: Diagnosis not present

## 2020-02-21 DIAGNOSIS — E039 Hypothyroidism, unspecified: Secondary | ICD-10-CM | POA: Diagnosis not present

## 2020-02-21 DIAGNOSIS — Z6835 Body mass index (BMI) 35.0-35.9, adult: Secondary | ICD-10-CM | POA: Diagnosis not present

## 2020-02-21 DIAGNOSIS — D638 Anemia in other chronic diseases classified elsewhere: Secondary | ICD-10-CM | POA: Diagnosis not present

## 2020-02-21 DIAGNOSIS — E785 Hyperlipidemia, unspecified: Secondary | ICD-10-CM | POA: Diagnosis not present

## 2020-02-21 DIAGNOSIS — I5032 Chronic diastolic (congestive) heart failure: Secondary | ICD-10-CM | POA: Diagnosis not present

## 2020-02-21 DIAGNOSIS — D519 Vitamin B12 deficiency anemia, unspecified: Secondary | ICD-10-CM | POA: Diagnosis not present

## 2020-02-21 DIAGNOSIS — L239 Allergic contact dermatitis, unspecified cause: Secondary | ICD-10-CM | POA: Diagnosis not present

## 2020-02-21 DIAGNOSIS — M5136 Other intervertebral disc degeneration, lumbar region: Secondary | ICD-10-CM | POA: Diagnosis not present

## 2020-02-21 DIAGNOSIS — I4891 Unspecified atrial fibrillation: Secondary | ICD-10-CM | POA: Diagnosis not present

## 2020-02-21 DIAGNOSIS — N183 Chronic kidney disease, stage 3 unspecified: Secondary | ICD-10-CM | POA: Diagnosis not present

## 2020-02-25 ENCOUNTER — Ambulatory Visit: Payer: PPO | Admitting: Cardiology

## 2020-02-25 ENCOUNTER — Other Ambulatory Visit: Payer: Self-pay | Admitting: Cardiology

## 2020-02-25 ENCOUNTER — Other Ambulatory Visit: Payer: Self-pay

## 2020-02-25 ENCOUNTER — Encounter: Payer: Self-pay | Admitting: Cardiology

## 2020-02-25 VITALS — BP 140/70 | HR 89 | Ht 64.0 in | Wt 207.0 lb

## 2020-02-25 DIAGNOSIS — I480 Paroxysmal atrial fibrillation: Secondary | ICD-10-CM | POA: Diagnosis not present

## 2020-02-25 DIAGNOSIS — I130 Hypertensive heart and chronic kidney disease with heart failure and stage 1 through stage 4 chronic kidney disease, or unspecified chronic kidney disease: Secondary | ICD-10-CM

## 2020-02-25 DIAGNOSIS — Z7901 Long term (current) use of anticoagulants: Secondary | ICD-10-CM | POA: Diagnosis not present

## 2020-02-25 DIAGNOSIS — Z79899 Other long term (current) drug therapy: Secondary | ICD-10-CM | POA: Diagnosis not present

## 2020-02-25 DIAGNOSIS — E785 Hyperlipidemia, unspecified: Secondary | ICD-10-CM

## 2020-02-25 MED ORDER — AMIODARONE HCL 200 MG PO TABS: 200.0000 mg | ORAL_TABLET | Freq: Two times a day (BID) | ORAL | 3 refills | Status: DC
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
## 2020-02-25 NOTE — Patient Instructions (Signed)
Medication Instructions:  Your physician has recommended you make the following change in your medication:  INCREASE: Amiodarone 200 mg take one tablet by mouth twice daily.   You can hold your Eliquis for 3 days prior to your colonoscopy.  *If you need a refill on your cardiac medications before your next appointment, please call your pharmacy*   Lab Work: None If you have labs (blood work) drawn today and your tests are completely normal, you will receive your results only by: Marland Kitchen MyChart Message (if you have MyChart) OR . A paper copy in the mail If you have any lab test that is abnormal or we need to change your treatment, we will call you to review the results.   Testing/Procedures: A zio monitor was ordered today. It will remain on for 3 days. Please put this monitor on in 1 week. You will then return monitor and event diary in provided box. It takes 1-2 weeks for report to be downloaded and returned to [place]. We will call you with the results. If monitor falls off or has orange flashing light, please call Zio for further instructions.      Follow-Up: At [facility], you and your health needs are our priority.  As part of our continuing mission to provide you with exceptional heart care, we have created designated Provider Care Teams.  These Care Teams include your primary Cardiologist (physician) and Advanced Practice Providers (APPs -  Physician Assistants and Nurse Practitioners) who all work together to provide you with the care you need, when you need it.  We recommend signing up for the patient portal called "MyChart".  Sign up information is provided on this After Visit Summary.  MyChart is used to connect with patients for Virtual Visits (Telemedicine).  Patients are able to view lab/test results, encounter notes, upcoming appointments, etc.  Non-urgent messages can be sent to your provider as well.   To learn more about what you can do with MyChart, go to  [email or web address].    Your next appointment:   3 week(s)  The format for your next appointment:   In Person  Provider:   Shirlee More, MD   Other Instructions

## 2020-02-27 DIAGNOSIS — Z1231 Encounter for screening mammogram for malignant neoplasm of breast: Secondary | ICD-10-CM | POA: Diagnosis not present

## 2020-03-03 DIAGNOSIS — E785 Hyperlipidemia, unspecified: Secondary | ICD-10-CM | POA: Diagnosis not present

## 2020-03-03 DIAGNOSIS — Z0000 Encounter for general adult medical examination without abnormal findings: Secondary | ICD-10-CM | POA: Diagnosis not present

## 2020-03-03 DIAGNOSIS — Z1331 Encounter for screening for depression: Secondary | ICD-10-CM | POA: Diagnosis not present

## 2020-03-03 DIAGNOSIS — Z9181 History of falling: Secondary | ICD-10-CM | POA: Diagnosis not present

## 2020-03-03 DIAGNOSIS — Z6835 Body mass index (BMI) 35.0-35.9, adult: Secondary | ICD-10-CM | POA: Diagnosis not present

## 2020-03-04 ENCOUNTER — Ambulatory Visit (INDEPENDENT_AMBULATORY_CARE_PROVIDER_SITE_OTHER): Payer: PPO

## 2020-03-04 DIAGNOSIS — I480 Paroxysmal atrial fibrillation: Secondary | ICD-10-CM | POA: Diagnosis not present

## 2020-03-04 DIAGNOSIS — C50412 Malignant neoplasm of upper-outer quadrant of left female breast: Secondary | ICD-10-CM | POA: Diagnosis not present

## 2020-03-04 DIAGNOSIS — Z7901 Long term (current) use of anticoagulants: Secondary | ICD-10-CM

## 2020-03-04 DIAGNOSIS — I130 Hypertensive heart and chronic kidney disease with heart failure and stage 1 through stage 4 chronic kidney disease, or unspecified chronic kidney disease: Secondary | ICD-10-CM

## 2020-03-04 DIAGNOSIS — Z79899 Other long term (current) drug therapy: Secondary | ICD-10-CM

## 2020-03-04 DIAGNOSIS — Z170 Estrogen receptor positive status [ER+]: Secondary | ICD-10-CM | POA: Diagnosis not present

## 2020-03-04 DIAGNOSIS — E785 Hyperlipidemia, unspecified: Secondary | ICD-10-CM

## 2020-03-06 ENCOUNTER — Telehealth: Payer: Self-pay

## 2020-03-06 NOTE — Telephone Encounter (Signed)
Patient's recent appointment with cardiologist documented that she could hold her Eliquis for 3 days prior to her procedure.  Lm on patient's vm to clarify that she received this information.

## 2020-03-14 NOTE — Telephone Encounter (Signed)
Lm on vm to make sure patient knows to hold Eliquis for 3 days prior to her procedure

## 2020-03-17 NOTE — Progress Notes (Signed)
Cardiology Office Note:    Date:  03/18/2020   ID:  Melanie Montes, DOB Mar 09, 1936, MRN [id number]  PCP:  Nicoletta Dress, MD  Cardiologist:  Shirlee More, MD    Referring MD: Nicoletta Dress, MD    ASSESSMENT:    1. Paroxysmal atrial fibrillation (HCC)   2. PAT (paroxysmal atrial tachycardia) ([place])   3. Chronic anticoagulation   4. On amiodarone therapy   5. Hypertensive heart and chronic kidney disease with heart failure and stage 1 through stage 4 chronic kidney disease, or unspecified chronic kidney disease ([place])   6. Hyperlipidemia, unspecified hyperlipidemia type    PLAN:    In order of problems listed above:  1. Remains in persistent atrial fibrillation continue amiodarone cardioversion is planned when she resumes anticoagulation 3 weeks 2. Continue amiodarone no signs of toxicity 3. Stable BP at target continue treatment calcium channel blocker beta-blocker diuretic.  Heart failure is compensated. 4. Lipids at target continue current treatment involving OTC statin and WelChol   Next appointment: 1 month   Medication Adjustments/Labs and Tests Ordered: Current medicines are reviewed at length with the patient today.  Concerns regarding medicines are outlined above.  Orders Placed This Encounter  Procedures  . EKG 12-Lead   No orders of the defined types were placed in this encounter.   Chief Complaint  Patient presents with  . Follow-up  . Atrial Fibrillation    History of Present Illness:    Melanie Montes is a 84 y.o. female with a hx of HF, Dyslipidemia, HTN , carotid stenosis CKD and SVT/PAT  persistent AF and anticoagulated  successfully cardioverted to [facility] on amiodarone  last seen 10/19/2019.  Prior to that visit she underwent a myocardial perfusion study 09/26/2019 which showed an ejection fraction of 67% no ischemia normal left ventricular function.  Her EKG showed incomplete right bundle branch block.  She was last seen 02/25/2020 she was in  atrial fibrillation and amiodarone dose was increased.. Compliance with diet, lifestyle and medications: Yes  Somewhat paradoxically she feels better but remains in atrial fibrillation.  I advise cardioversion she agrees but is scheduled for colonoscopy on the third wants to have that done we will resume her anticoagulant I will see at the end of next month and we will plan on scheduling outpatient cardioversion her rate is better controlled I will leave her on higher dose amiodarone.  No chest pain edema shortness of breath palpitation or syncope. Past Medical History:  Diagnosis Date  . Abnormal CT scan, chest 05/02/2012   Followed in Pulmonary clinic/ [facility]/ [place]    - CT Chest 04/17/12:  Bilateral clustered nodules both upper lobes and sup segment of lower lobes     . Anemia, pernicious   . Breast cancer ([place])    Left  . Breast cancer of upper-outer quadrant of left female breast ([place]) 01/19/2016  . Carotid artery occlusion without infarction   . Carotid stenosis 05/17/2018  . Chest pain in adult 06/30/2015   Overview:  Overview:  Stress echo negative for ischemia at 7 mets July 2016 Overview:  Stress echo negative for ischemia at 7 mets July 2016  . Chronic diastolic heart failure ([place]) 06/30/2015  . CKD (chronic kidney disease) 06/30/2015  . Colonic polyp   . COPD (chronic obstructive pulmonary disease) ([place]) 06/02/2012   Followed in Pulmonary clinic/ [facility]/ [place]    - PFT's 06/02/2012 FEV1  1.72 (86%) ratio 68 with marked air trapping  And  DLCO 67%   . Cough 05/02/2012   Followed in Pulmonary clinic/ [facility]/ [place]    . Degeneration of lumbar or lumbosacral intervertebral disc   . Edema   . Fibrocystic breast changes, bilateral 01/23/2018  . GERD (gastroesophageal reflux disease)   . HTN (hypertension), benign   . Hyperlipidemia   . Hypertensive heart and chronic kidney disease with heart failure and stage 1 through stage 4 chronic kidney disease, or  unspecified chronic kidney disease ([place]) 06/30/2015  . Hypothyroidism   . Paroxysmal atrial fibrillation (HCC)   . PAT (paroxysmal atrial tachycardia) ([place]) 12/13/2017  . Restless legs     Past Surgical History:  Procedure Laterality Date  . ABDOMINAL HYSTERECTOMY  1970  . BREAST MASS EXCISION  2009   left//malignant  . CARDIOVERSION N/A 03/09/2018   Procedure: CARDIOVERSION;  Surgeon: Larey Dresser, MD;  Location[facility] ENDOSCOPY;  Service: Cardiovascular;  Laterality: N/A;  . CARDIOVERSION N/A 08/04/2018   Procedure: CARDIOVERSION;  Surgeon: Sueanne Margarita, MD;  Location: [facility] ENDOSCOPY;  Service: Cardiovascular;  Laterality: N/A;  . SPINE SURGERY  1980   lumbar    Current Medications: Current Meds  Medication Sig  . Albuterol Sulfate (PROAIR RESPICLICK) 144 (90 Base) MCG/ACT AEPB Inhale 2 puffs into the lungs every 6 (six) hours as needed (shortness of breath).   . ALPRAZolam (XANAX) 0.25 MG tablet Take 0.25 mg by mouth daily as needed for anxiety.   Marland Kitchen amiodarone (PACERONE) 200 MG tablet Take 1 tablet (200 mg total) by mouth 2 (two) times daily.  Marland Kitchen amLODipine (NORVASC) 5 MG tablet Take 5 mg by mouth daily.   . cyanocobalamin (,VITAMIN B-12,) 1000 MCG/ML injection Inject 1,000 mcg into the muscle every 30 (thirty) days.  Marland Kitchen diltiazem (TIAZAC) 240 MG 24 hr capsule Take by mouth.  Arne Cleveland 5 MG TABS tablet TAKE 1 TABLET BY MOUTH TWICE DAILY  . ezetimibe (ZETIA) 10 MG tablet Take 10 mg by mouth daily.  . famotidine (PEPCID) 40 MG tablet Take 40 mg by mouth daily.  . Glucosamine-Chondroit-Vit C-Mn (GLUCOSAMINE 1500 COMPLEX PO) Take 1,500 mg by mouth 2 (two) times daily.   Marland Kitchen HYDROcodone-acetaminophen (NORCO/VICODIN) 5-325 MG tablet Take 1 tablet by mouth every 6 (six) hours as needed.   . hydrOXYzine (ATARAX/VISTARIL) 25 MG tablet   . isosorbide mononitrate (IMDUR) 30 MG 24 hr tablet TAKE 1 TABLET BY MOUTH DAILY  . levothyroxine (SYNTHROID) 50 MCG tablet Take 50 mcg by mouth daily.  .  metoprolol succinate (TOPROL-XL) 100 MG 24 hr tablet Take 50 mg by mouth 2 (two) times daily.  . niacin 500 MG tablet Take 1,000 mg by mouth at bedtime.   . nitroGLYCERIN (NITROSTAT) 0.4 MG SL tablet Place 1 tablet (0.4 mg total) under the tongue every 5 (five) minutes as needed.  . Omega-3 Fatty Acids (FISH OIL) 1200 MG CAPS Take 1,200 mg by mouth 2 (two) times daily.   . potassium chloride (K-DUR,KLOR-CON) 10 MEQ tablet Take 10 mEq by mouth daily.  . Red Yeast Rice 600 MG TABS Take 600 mg by mouth 2 (two) times daily.   Marland Kitchen SUTAB [phone] MG TABS   . torsemide (DEMADEX) 20 MG tablet TAKE 1 TABLET BY MOUTH 2 TIMES DAILY.  . vitamin E (VITAMIN E) 1000 UNIT capsule Take 1,000 Units by mouth daily.  Earnestine Mealing 625 MG tablet Take 1,875 mg by mouth 2 (two) times daily.     Allergies:   Penicillins and Statins   Social History  Socioeconomic History  . Marital status: Married    Spouse name: Not on file  . Number of children: Not on file  . Years of education: Not on file  . Highest education level: Not on file  Occupational History  . Occupation: retired  Tobacco Use  . Smoking status: Former Smoker    Packs/day: 1.00    Years: 20.00    Pack years: 20.00    Types: Cigarettes    Quit date: 07/26/2000    Years since quitting: 19.6  . Smokeless tobacco: Never Used  Vaping Use  . Vaping Use: Never used  Substance and Sexual Activity  . Alcohol use: No  . Drug use: No  . Sexual activity: Not on file  Other Topics Concern  . Not on file  Social History Narrative  . Not on file   Social Determinants of Health   Financial Resource Strain:   . Difficulty of Paying Living Expenses: Not on file  Food Insecurity:   . Worried About Charity fundraiser in the Last Year: Not on file  . Ran Out of Food in the Last Year: Not on file  Transportation Needs:   . Lack of Transportation (Medical): Not on file  . Lack of Transportation (Non-Medical): Not on file  Physical Activity:   .  Days of Exercise per Week: Not on file  . Minutes of Exercise per Session: Not on file  Stress:   . Feeling of Stress : Not on file  Social Connections:   . Frequency of Communication with Friends and Family: Not on file  . Frequency of Social Gatherings with Friends and Family: Not on file  . Attends Religious Services: Not on file  . Active Member of Clubs or Organizations: Not on file  . Attends Archivist Meetings: Not on file  . Marital Status: Not on file     Family History: The patient's family history includes Alzheimer's disease in her mother; Aneurysm in her mother; Colon cancer in her father and paternal aunt; Heart disease in her maternal grandmother and paternal grandmother; Stomach cancer in her paternal grandmother; Stroke in her mother; Transient ischemic attack in her father. ROS:   Please see the history of present illness.    All other systems reviewed and are negative.  EKGs/Labs/Other Studies Reviewed:    The following studies were reviewed today:  EKG:  EKG ordered today and personally reviewed.  The ekg ordered today demonstrates atrial fibrillation heart rate 107 bpm  Recent Labs: 02/21/2020 cholesterol 164 LDL 101 triglycerides 125 HDL 41 creatinine 1.82 elevated TSH normal 5.9H  Physical Exam:    VS:  BP 132/62   Pulse (!) 107   Ht 5\' 4"  (1.626 m)   Wt 208 lb (94.3 kg)   SpO2 97%   BMI 35.70 kg/m     Wt Readings from Last 3 Encounters:  03/18/20 208 lb (94.3 kg)  02/25/20 (!) 207 lb (93.9 kg)  01/23/20 205 lb (93 kg)     GEN:  Well nourished, well developed in no acute distress HEENT: Normal NECK: No JVD; No carotid bruits LYMPHATICS: No lymphadenopathy CARDIAC: S1 is variable rhythm is irregular no murmurs, rubs, gallops RESPIRATORY:  Clear to auscultation without rales, wheezing or rhonchi  ABDOMEN: Soft, non-tender, non-distended MUSCULOSKELETAL:  No edema; No deformity  SKIN: Warm and dry NEUROLOGIC:  Alert and oriented x  3 PSYCHIATRIC:  Normal affect    Signed, Shirlee More, MD  03/18/2020 12:02 PM  [facility]

## 2020-03-18 ENCOUNTER — Ambulatory Visit: Payer: PPO | Admitting: Cardiology

## 2020-03-18 ENCOUNTER — Encounter: Payer: Self-pay | Admitting: Cardiology

## 2020-03-18 ENCOUNTER — Other Ambulatory Visit: Payer: Self-pay

## 2020-03-18 VITALS — BP 132/62 | HR 107 | Ht 64.0 in | Wt 208.0 lb

## 2020-03-18 DIAGNOSIS — I480 Paroxysmal atrial fibrillation: Secondary | ICD-10-CM

## 2020-03-18 DIAGNOSIS — Z7901 Long term (current) use of anticoagulants: Secondary | ICD-10-CM | POA: Diagnosis not present

## 2020-03-18 DIAGNOSIS — I4719 Other supraventricular tachycardia: Secondary | ICD-10-CM

## 2020-03-18 DIAGNOSIS — Z79899 Other long term (current) drug therapy: Secondary | ICD-10-CM

## 2020-03-18 DIAGNOSIS — I471 Supraventricular tachycardia: Secondary | ICD-10-CM | POA: Diagnosis not present

## 2020-03-18 DIAGNOSIS — E785 Hyperlipidemia, unspecified: Secondary | ICD-10-CM | POA: Diagnosis not present

## 2020-03-18 DIAGNOSIS — I130 Hypertensive heart and chronic kidney disease with heart failure and stage 1 through stage 4 chronic kidney disease, or unspecified chronic kidney disease: Secondary | ICD-10-CM

## 2020-03-18 MED ORDER — APIXABAN 5 MG PO TABS: 5.0000 mg | ORAL_TABLET | Freq: Two times a day (BID) | ORAL | 3 refills | Status: DC

## 2020-03-18 MED ORDER — AMIODARONE HCL 200 MG PO TABS: 200.0000 mg | ORAL_TABLET | Freq: Two times a day (BID) | ORAL | 3 refills | Status: DC

## 2020-03-18 NOTE — Patient Instructions (Signed)
Medication Instructions:  Your physician recommends that you continue on your current medications as directed. Please refer to the Current Medication list given to you today.  *If you need a refill on your cardiac medications before your next appointment, please call your pharmacy*   Lab Work: None If you have labs (blood work) drawn today and your tests are completely normal, you will receive your results only by: Marland Kitchen MyChart Message (if you have MyChart) OR . A paper copy in the mail If you have any lab test that is abnormal or we need to change your treatment, we will call you to review the results.   Testing/Procedures: None   Follow-Up: At [facility], you and your health needs are our priority.  As part of our continuing mission to provide you with exceptional heart care, we have created designated Provider Care Teams.  These Care Teams include your primary Cardiologist (physician) and Advanced Practice Providers (APPs -  Physician Assistants and Nurse Practitioners) who all work together to provide you with the care you need, when you need it.  We recommend signing up for the patient portal called "MyChart".  Sign up information is provided on this After Visit Summary.  MyChart is used to connect with patients for Virtual Visits (Telemedicine).  Patients are able to view lab/test results, encounter notes, upcoming appointments, etc.  Non-urgent messages can be sent to your provider as well.   To learn more about what you can do with MyChart, go to [email or web address].    Your next appointment:   1 month(s)  The format for your next appointment:   In Person  Provider:   Shirlee More, MD   Other Instructions

## 2020-03-19 DIAGNOSIS — I482 Chronic atrial fibrillation, unspecified: Secondary | ICD-10-CM | POA: Diagnosis not present

## 2020-03-24 ENCOUNTER — Telehealth: Payer: Self-pay

## 2020-03-24 DIAGNOSIS — D519 Vitamin B12 deficiency anemia, unspecified: Secondary | ICD-10-CM | POA: Diagnosis not present

## 2020-03-24 NOTE — Telephone Encounter (Signed)
-----   Message from Richardo Priest, MD sent at 03/24/2020  7:58 AM EDT ----- Good news predominantly not in atrial fibrillation 97% of the time continue amiodarone and cancel planned cardioversion.

## 2020-03-24 NOTE — Telephone Encounter (Signed)
Spoke with patient regarding results and recommendation.  Patient verbalizes understanding and is agreeable to plan of care. Advised patient to call back with any issues or concerns.  

## 2020-03-25 NOTE — Telephone Encounter (Signed)
Spoke with patient who clarified she knows to hold her Eliquis for three days prior to the procedure.

## 2020-03-27 ENCOUNTER — Encounter: Payer: Self-pay | Admitting: Internal Medicine

## 2020-03-27 ENCOUNTER — Ambulatory Visit (AMBULATORY_SURGERY_CENTER): Payer: PPO | Admitting: Internal Medicine

## 2020-03-27 ENCOUNTER — Other Ambulatory Visit: Payer: Self-pay

## 2020-03-27 VITALS — BP 127/61 | HR 102 | Temp 97.9°F | Resp 17 | Ht 64.0 in | Wt 205.0 lb

## 2020-03-27 DIAGNOSIS — I4891 Unspecified atrial fibrillation: Secondary | ICD-10-CM | POA: Diagnosis not present

## 2020-03-27 DIAGNOSIS — G2581 Restless legs syndrome: Secondary | ICD-10-CM | POA: Diagnosis not present

## 2020-03-27 DIAGNOSIS — D122 Benign neoplasm of ascending colon: Secondary | ICD-10-CM

## 2020-03-27 DIAGNOSIS — K621 Rectal polyp: Secondary | ICD-10-CM | POA: Diagnosis not present

## 2020-03-27 DIAGNOSIS — I2510 Atherosclerotic heart disease of native coronary artery without angina pectoris: Secondary | ICD-10-CM | POA: Diagnosis not present

## 2020-03-27 DIAGNOSIS — Z800 Family history of malignant neoplasm of digestive organs: Secondary | ICD-10-CM | POA: Diagnosis not present

## 2020-03-27 DIAGNOSIS — D120 Benign neoplasm of cecum: Secondary | ICD-10-CM | POA: Diagnosis not present

## 2020-03-27 DIAGNOSIS — Z86010 Personal history of colonic polyps: Secondary | ICD-10-CM

## 2020-03-27 DIAGNOSIS — D128 Benign neoplasm of rectum: Secondary | ICD-10-CM

## 2020-03-27 DIAGNOSIS — D124 Benign neoplasm of descending colon: Secondary | ICD-10-CM

## 2020-03-27 DIAGNOSIS — D123 Benign neoplasm of transverse colon: Secondary | ICD-10-CM | POA: Diagnosis not present

## 2020-03-27 DIAGNOSIS — I10 Essential (primary) hypertension: Secondary | ICD-10-CM | POA: Diagnosis not present

## 2020-03-27 DIAGNOSIS — J449 Chronic obstructive pulmonary disease, unspecified: Secondary | ICD-10-CM | POA: Diagnosis not present

## 2020-03-27 HISTORY — PX: COLONOSCOPY: SHX174

## 2020-03-27 MED ORDER — SODIUM CHLORIDE 0.9 % IV SOLN: 500.0000 mL | Freq: Once | INTRAVENOUS | Status: DC

## 2020-03-27 NOTE — Op Note (Signed)
[facility] Patient Name: Melanie Montes Procedure Date: 03/27/2020 7:16 AM MRN: [id number] Endoscopist: Docia Chuck. Henrene Pastor , MD Age: 84 Referring MD:  Date of Birth: 1936/04/26 Gender: Female Account #: [id number] Procedure:                Colonoscopy with cold snare polypectomy x 16; with                            biopsies Indications:              High risk colon cancer surveillance: Personal                            history of multiple (3 or more) adenomas Medicines:                Monitored Anesthesia Care Procedure:                Pre-Anesthesia Assessment:                           - Prior to the procedure, a History and Physical                            was performed, and patient medications and                            allergies were reviewed. The patient's tolerance of                            previous anesthesia was also reviewed. The risks                            and benefits of the procedure and the sedation                            options and risks were discussed with the patient.                            All questions were answered, and informed consent                            was obtained. Prior Anticoagulants: The patient has                            taken Eliquis (apixaban), last dose was 3 days                            prior to procedure. ASA Grade Assessment: III - A                            patient with severe systemic disease. After                            reviewing the risks and benefits, the patient was  deemed in satisfactory condition to undergo the                            procedure.                           After obtaining informed consent, the colonoscope                            was passed under direct vision. Throughout the                            procedure, the patient's blood pressure, pulse, and                            oxygen saturations were monitored continuously. The                             Colonoscope was introduced through the anus and                            advanced to the the cecum, identified by                            appendiceal orifice and ileocecal valve. The                            ileocecal valve, appendiceal orifice, and rectum                            were photographed. The quality of the bowel                            preparation was excellent. The colonoscopy was                            performed without difficulty. The patient tolerated                            the procedure well. The bowel preparation used was                            SUPREP via split dose instruction. Scope In: 8:21:02 AM Scope Out: 8:44:29 AM Scope Withdrawal Time: 0 hours 18 minutes 32 seconds  Total Procedure Duration: 0 hours 23 minutes 27 seconds  Findings:                 16 polyps were found in the descending colon,                            transverse colon, ascending colon and cecum. The                            polyps were 1 to 10 mm in size. These polyps were  removed with a cold snare. Resection and retrieval                            were complete.                           A less than 1 mm polyp was found in the rectum.                            Biopsies were taken with a cold forceps for                            histology.                           A few diverticula were found in the sigmoid colon.                           The exam was otherwise without abnormality on                            direct and retroflexion views. Complications:            No immediate complications. Estimated blood loss:                            None. Estimated Blood Loss:     Estimated blood loss: none. Impression:               -16, 1 to 10 mm polyps in the descending colon, in                            the transverse colon, in the ascending colon and in                            the cecum, removed with a cold snare.  Resected and                            retrieved.                           - One less than 1 mm polyp in the rectum. Biopsied.                           -Mild diverticulosis                           -Otherwise normal exam Recommendation:           - Repeat colonoscopy in 1 year for surveillance.                           - Patient has a contact number available for                            emergencies. The signs and symptoms of  potential                            delayed complications were discussed with the                            patient. Return to normal activities tomorrow.                            Written discharge instructions were provided to the                            patient.                           - Resume previous diet.                           - Continue present medications.                           -RESUME ELIQUIS IN 2 DAYS (Saturday, March 29, 2020) Docia Chuck. Henrene Pastor, MD 03/27/2020 9:13:06 AM This report has been signed electronically.

## 2020-03-27 NOTE — Patient Instructions (Signed)
Handout on polyps and diverticulosis. Resume Eliquis in two days, on Saturday Sept. 4, 2021   YOU HAD AN ENDOSCOPIC PROCEDURE TODAY AT [facility]:   Refer to the procedure report that was given to you for any specific questions about what was found during the examination.  If the procedure report does not answer your questions, please call your gastroenterologist to clarify.  If you requested that your care partner not be given the details of your procedure findings, then the procedure report has been included in a sealed envelope for you to review at your convenience later.  YOU SHOULD EXPECT: Some feelings of bloating in the abdomen. Passage of more gas than usual.  Walking can help get rid of the air that was put into your GI tract during the procedure and reduce the bloating. If you had a lower endoscopy (such as a colonoscopy or flexible sigmoidoscopy) you may notice spotting of blood in your stool or on the toilet paper. If you underwent a bowel prep for your procedure, you may not have a normal bowel movement for a few days.  Please Note:  You might notice some irritation and congestion in your nose or some drainage.  This is from the oxygen used during your procedure.  There is no need for concern and it should clear up in a day or so.  SYMPTOMS TO REPORT IMMEDIATELY:   Following lower endoscopy (colonoscopy or flexible sigmoidoscopy):  Excessive amounts of blood in the stool  Significant tenderness or worsening of abdominal pains  Swelling of the abdomen that is new, acute  Fever of 100F or higher   For urgent or emergent issues, a gastroenterologist can be reached at any hour by calling [phone]. Do not use MyChart messaging for urgent concerns.    DIET:  We do recommend a small meal at first, but then you may proceed to your regular diet.  Drink plenty of fluids but you should avoid alcoholic beverages for 24 hours.  ACTIVITY:  You should plan to take it  easy for the rest of today and you should NOT DRIVE or use heavy machinery until tomorrow (because of the sedation medicines used during the test).    FOLLOW UP: Our staff will call the number listed on your records 48-72 hours following your procedure to check on you and address any questions or concerns that you may have regarding the information given to you following your procedure. If we do not reach you, we will leave a message.  We will attempt to reach you two times.  During this call, we will ask if you have developed any symptoms of COVID 19. If you develop any symptoms (ie: fever, flu-like symptoms, shortness of breath, cough etc.) before then, please call [phone].  If you test positive for Covid 19 in the 2 weeks post procedure, please call and report this information to [place].    If any biopsies were taken you will be contacted by phone or by letter within the next 1-3 weeks.  Please call us at [phone] if you have not heard about the biopsies in 3 weeks.    SIGNATURES/CONFIDENTIALITY: You and/or your care partner have signed paperwork which will be entered into your electronic medical record.  These signatures attest to the fact that that the information above on your After Visit Summary has been reviewed and is understood.  Full responsibility of the confidentiality of this discharge information lies with you and/or your care-partner.

## 2020-03-27 NOTE — Progress Notes (Signed)
Report to PACU, RN, vss, BBS= Clear.  

## 2020-03-27 NOTE — Progress Notes (Signed)
Called to room to assist during endoscopic procedure.  Patient ID and intended procedure confirmed with present staff. Received instructions for my participation in the procedure from the performing physician.  

## 2020-03-27 NOTE — Progress Notes (Signed)
Dr Henrene Pastor aware of ephedrine.  pts MAP dropping lower than I would like with bilat. 60-80% carotid stenosis.  Sedation just enough to keep her moving at a minimum

## 2020-03-27 NOTE — Progress Notes (Signed)
Vital signs checked by:CW ? ?The medical and surgical history was reviewed and verified with the patient. ? ?

## 2020-04-01 ENCOUNTER — Telehealth: Payer: Self-pay | Admitting: *Deleted

## 2020-04-01 ENCOUNTER — Telehealth: Payer: Self-pay

## 2020-04-01 ENCOUNTER — Encounter: Payer: Self-pay | Admitting: Internal Medicine

## 2020-04-01 NOTE — Telephone Encounter (Signed)
1st follow up call made.  NAULM 

## 2020-04-01 NOTE — Telephone Encounter (Signed)
°  Follow up Call-  Call back number 03/27/2020  Post procedure Call Back phone  # [phone]  Permission to leave phone message Yes  Some recent data might be hidden   Voicemail full- no message se tup

## 2020-04-21 DIAGNOSIS — M5137 Other intervertebral disc degeneration, lumbosacral region: Secondary | ICD-10-CM | POA: Insufficient documentation

## 2020-04-21 DIAGNOSIS — K219 Gastro-esophageal reflux disease without esophagitis: Secondary | ICD-10-CM | POA: Insufficient documentation

## 2020-04-21 DIAGNOSIS — C50919 Malignant neoplasm of unspecified site of unspecified female breast: Secondary | ICD-10-CM | POA: Insufficient documentation

## 2020-04-21 DIAGNOSIS — K635 Polyp of colon: Secondary | ICD-10-CM | POA: Insufficient documentation

## 2020-04-21 DIAGNOSIS — D510 Vitamin B12 deficiency anemia due to intrinsic factor deficiency: Secondary | ICD-10-CM | POA: Insufficient documentation

## 2020-04-21 DIAGNOSIS — E039 Hypothyroidism, unspecified: Secondary | ICD-10-CM | POA: Insufficient documentation

## 2020-04-21 DIAGNOSIS — R609 Edema, unspecified: Secondary | ICD-10-CM | POA: Insufficient documentation

## 2020-04-21 DIAGNOSIS — I6529 Occlusion and stenosis of unspecified carotid artery: Secondary | ICD-10-CM | POA: Insufficient documentation

## 2020-04-21 DIAGNOSIS — I10 Essential (primary) hypertension: Secondary | ICD-10-CM | POA: Insufficient documentation

## 2020-04-21 DIAGNOSIS — G2581 Restless legs syndrome: Secondary | ICD-10-CM | POA: Insufficient documentation

## 2020-04-22 ENCOUNTER — Other Ambulatory Visit: Payer: Self-pay

## 2020-04-22 ENCOUNTER — Encounter: Payer: Self-pay | Admitting: Cardiology

## 2020-04-22 ENCOUNTER — Ambulatory Visit: Payer: PPO | Admitting: Cardiology

## 2020-04-22 VITALS — BP 164/79 | HR 116 | Ht 64.0 in | Wt 207.6 lb

## 2020-04-22 DIAGNOSIS — Z79899 Other long term (current) drug therapy: Secondary | ICD-10-CM

## 2020-04-22 DIAGNOSIS — Z7901 Long term (current) use of anticoagulants: Secondary | ICD-10-CM

## 2020-04-22 DIAGNOSIS — I4819 Other persistent atrial fibrillation: Secondary | ICD-10-CM | POA: Diagnosis not present

## 2020-04-22 DIAGNOSIS — I130 Hypertensive heart and chronic kidney disease with heart failure and stage 1 through stage 4 chronic kidney disease, or unspecified chronic kidney disease: Secondary | ICD-10-CM

## 2020-04-22 MED ORDER — AMLODIPINE BESYLATE 5 MG PO TABS
5.0000 mg | ORAL_TABLET | Freq: Every day | ORAL | 3 refills | Status: DC
Start: 2020-04-22 — End: 2024-02-14

## 2020-04-22 NOTE — Progress Notes (Signed)
Cardiology Office Note:    Date:  04/22/2020   ID:  Melanie Montes, DOB [birth year], MRN [id number]  PCP:  Nicoletta Dress, MD  Cardiologist:  Shirlee More, MD    Referring MD: Nicoletta Dress, MD    ASSESSMENT:    1. Persistent atrial fibrillation ([place])   2. On amiodarone therapy   3. Chronic anticoagulation   4. Hypertensive heart and chronic kidney disease with heart failure and stage 1 through stage 4 chronic kidney disease, or unspecified chronic kidney disease ([place])    PLAN:    In order of problems listed above:  1. She remains in atrial tachyarrhythmia however the rate is controlled now with beta-blocker calcium channel blocker amiodarone she is undecided about cardioversion asked to delay 6 weeks continue same medications anticoagulant and reassess back in the office.  If she does not progress to cardioversion at that time I will stop amiodarone. 2. BP at target repeat by me 140/80 heart failure is compensated continue her current loop diuretic   Next appointment: 6 weeks for decision about resuming sinus rhythm   Medication Adjustments/Labs and Tests Ordered: Current medicines are reviewed at length with the patient today.  Concerns regarding medicines are outlined above.  No orders of the defined types were placed in this encounter.  No orders of the defined types were placed in this encounter.   Chief Complaint  Patient presents with  . Follow-up  . Atrial Fibrillation    History of Present Illness:    Melanie Montes is a 84 y.o. female with a hx of hypertensive heart disease with heart failure, atrial fibrillation which is persistent continued amiodarone with plan of repeat cardioversion when she was last seen 03/18/2020. Compliance with diet, lifestyle and medications: Yes  When seen today she is feeling much better unaware of her heart not having any exercise intolerance shortness of breath chest pain palpitation or syncope and attributes this to  better heart rate control with amiodarone.  Continues her anticoagulant without any bleeding complication.  She recently had colonoscopy. Past Medical History:  Diagnosis Date  . Abnormal CT scan, chest 05/02/2012   Followed in Pulmonary clinic/ [facility]/ [facility]    - CT Chest 04/17/12:  Bilateral clustered nodules both upper lobes and sup segment of lower lobes     . Anemia, pernicious   . Breast cancer ([place])    Left  . Breast cancer of upper-outer quadrant of left female breast ([place]) 01/19/2016  . Carotid artery occlusion without infarction   . Carotid stenosis 05/17/2018  . Chest pain in adult 06/30/2015   Overview:  Overview:  Stress echo negative for ischemia at 7 mets July 2016 Overview:  Stress echo negative for ischemia at 7 mets July 2016  . Chronic diastolic heart failure ([place]) 06/30/2015  . CKD (chronic kidney disease) 06/30/2015  . Colonic polyp   . COPD (chronic obstructive pulmonary disease) ([place]) 06/02/2012   Followed in Pulmonary clinic/ [facility]/ [place]    - PFT's 06/02/2012 FEV1  1.72 (86%) ratio 68 with marked air trapping  And DLCO 67%   . Cough 05/02/2012   Followed in Pulmonary clinic/ [facility]/ [place]    . Degeneration of lumbar or lumbosacral intervertebral disc   . Edema   . Fibrocystic breast changes, bilateral 01/23/2018  . GERD (gastroesophageal reflux disease)   . HTN (hypertension), benign   . Hyperlipidemia   . Hypertensive heart and chronic kidney disease with heart failure and stage 1  through stage 4 chronic kidney disease, or unspecified chronic kidney disease ([place]) 06/30/2015  . Hypothyroidism   . Paroxysmal atrial fibrillation (H[facility])   . PAT (paroxysmal atrial tachycardia) ([place]) 12/13/2017  . Restless legs     Past Surgical History:  Procedure Laterality Date  . ABDOMINAL HYSTERECTOMY  1970  . BREAST MASS EXCISION  2009   left//malignant  . CARDIOVERSION N/A 03/09/2018   Procedure: CARDIOVERSION;  Surgeon: Larey Dresser, MD;   Location: [facility] ENDOSCOPY;  Service: Cardiovascular;  Laterality: N/A;  . CARDIOVERSION N/A 08/04/2018   Procedure: CARDIOVERSION;  Surgeon: Sueanne Margarita, MD;  Location: [facility] ENDOSCOPY;  Service: Cardiovascular;  Laterality: N/A;  . COLONOSCOPY  03/27/2020  . SPINE SURGERY  1980   lumbar    Current Medications: Current Meds  Medication Sig  . Albuterol Sulfate (PROAIR RESPICLICK) 008 (90 Base) MCG/ACT AEPB Inhale 2 puffs into the lungs every 6 (six) hours as needed (shortness of breath).   . ALPRAZolam (XANAX) 0.25 MG tablet Take 0.25 mg by mouth daily as needed for anxiety.   Marland Kitchen amiodarone (PACERONE) 200 MG tablet Take 1 tablet (200 mg total) by mouth 2 (two) times daily.  Marland Kitchen amLODipine (NORVASC) 5 MG tablet Take 5 mg by mouth daily.   Marland Kitchen apixaban (ELIQUIS) 5 MG TABS tablet Take 1 tablet (5 mg total) by mouth 2 (two) times daily.  . cyanocobalamin (,VITAMIN B-12,) 1000 MCG/ML injection Inject 1,000 mcg into the muscle every 30 (thirty) days.  Marland Kitchen diltiazem (TIAZAC) 240 MG 24 hr capsule Take by mouth.  . ezetimibe (ZETIA) 10 MG tablet Take 10 mg by mouth daily.  . famotidine (PEPCID) 40 MG tablet Take 40 mg by mouth daily.  . Glucosamine-Chondroit-Vit C-Mn (GLUCOSAMINE 1500 COMPLEX PO) Take 1,500 mg by mouth 2 (two) times daily.   Marland Kitchen HYDROcodone-acetaminophen (NORCO/VICODIN) 5-325 MG tablet Take 1 tablet by mouth every 6 (six) hours as needed.   . hydrOXYzine (ATARAX/VISTARIL) 25 MG tablet   . isosorbide mononitrate (IMDUR) 30 MG 24 hr tablet TAKE 1 TABLET BY MOUTH DAILY  . levothyroxine (SYNTHROID) 50 MCG tablet Take 50 mcg by mouth daily.  . metoprolol succinate (TOPROL-XL) 100 MG 24 hr tablet Take 50 mg by mouth 2 (two) times daily.  . niacin 500 MG tablet Take 1,000 mg by mouth at bedtime.   . nitroGLYCERIN (NITROSTAT) 0.4 MG SL tablet Place 1 tablet (0.4 mg total) under the tongue every 5 (five) minutes as needed.  . Omega-3 Fatty Acids (FISH OIL) 1200 MG CAPS Take 1,200 mg by mouth 2 (two)  times daily.   . Red Yeast Rice 600 MG TABS Take 600 mg by mouth 2 (two) times daily.   Marland Kitchen torsemide (DEMADEX) 20 MG tablet TAKE 1 TABLET BY MOUTH 2 TIMES DAILY.  . vitamin E (VITAMIN E) 1000 UNIT capsule Take 1,000 Units by mouth daily.  Earnestine Mealing 625 MG tablet Take 1,875 mg by mouth 2 (two) times daily.  . [DISCONTINUED] potassium chloride (K-DUR,KLOR-CON) 10 MEQ tablet Take 10 mEq by mouth daily.     Allergies:   Penicillins and Statins   Social History   Socioeconomic History  . Marital status: Married    Spouse name: Not on file  . Number of children: Not on file  . Years of education: Not on file  . Highest education level: Not on file  Occupational History  . Occupation: retired  Tobacco Use  . Smoking status: Former Smoker    Packs/day: 1.00  Years: 20.00    Pack years: 20.00    Types: Cigarettes    Quit date: 07/26/2000    Years since quitting: 19.7  . Smokeless tobacco: Never Used  Vaping Use  . Vaping Use: Never used  Substance and Sexual Activity  . Alcohol use: No  . Drug use: No  . Sexual activity: Not on file  Other Topics Concern  . Not on file  Social History Narrative  . Not on file   Social Determinants of Health   Financial Resource Strain:   . Difficulty of Paying Living Expenses: Not on file  Food Insecurity:   . Worried About Charity fundraiser in the Last Year: Not on file  . Ran Out of Food in the Last Year: Not on file  Transportation Needs:   . Lack of Transportation (Medical): Not on file  . Lack of Transportation (Non-Medical): Not on file  Physical Activity:   . Days of Exercise per Week: Not on file  . Minutes of Exercise per Session: Not on file  Stress:   . Feeling of Stress : Not on file  Social Connections:   . Frequency of Communication with Friends and Family: Not on file  . Frequency of Social Gatherings with Friends and Family: Not on file  . Attends Religious Services: Not on file  . Active Member of Clubs or  Organizations: Not on file  . Attends Archivist Meetings: Not on file  . Marital Status: Not on file     Family History: The patient's family history includes Alzheimer's disease in her mother; Aneurysm in her mother; Colon cancer in her father and maternal aunt; Heart disease in her maternal grandmother and paternal grandmother; Stomach cancer in her paternal grandmother; Stroke in her mother; Transient ischemic attack in her father. There is no history of Esophageal cancer or Ulcerative colitis. ROS:   Please see the history of present illness.    All other systems reviewed and are negative.  EKGs/Labs/Other Studies Reviewed:    The following studies were reviewed today:  EKG:  EKG ordered today and personally reviewed.  The ekg ordered today demonstrates atrial fibrillation/atypical atrial flutter heart rate 100 110 bpm  Recent Labs: 02/21/2020: Cholesterol 164 LDL 101 triglycerides 125 HDL 41 creatinine 1.82 TSH normal 5.98  Physical Exam:    VS:  BP (!) 164/79   Pulse (!) 116   Ht 5\' 4"  (1.626 m)   Wt 207 lb 9.6 oz (94.2 kg)   SpO2 92%   BMI 35.63 kg/m     Wt Readings from Last 3 Encounters:  04/22/20 207 lb 9.6 oz (94.2 kg)  03/27/20 205 lb (93 kg)  03/18/20 208 lb (94.3 kg)     GEN:  Well nourished, well developed in no acute distress HEENT: Normal NECK: No JVD; No carotid bruits LYMPHATICS: No lymphadenopathy CARDIAC: Irregular S1 variable  no murmurs, rubs, gallops RESPIRATORY:  Clear to auscultation without rales, wheezing or rhonchi  ABDOMEN: Soft, non-tender, non-distended MUSCULOSKELETAL:  No edema; No deformity  SKIN: Warm and dry NEUROLOGIC:  Alert and oriented x 3 PSYCHIATRIC:  Normal affect    Signed, Shirlee More, MD  04/22/2020 1:17 PM    [facility]

## 2020-04-22 NOTE — Patient Instructions (Signed)

## 2020-04-24 DIAGNOSIS — D519 Vitamin B12 deficiency anemia, unspecified: Secondary | ICD-10-CM | POA: Diagnosis not present

## 2020-05-29 DIAGNOSIS — D638 Anemia in other chronic diseases classified elsewhere: Secondary | ICD-10-CM | POA: Diagnosis not present

## 2020-05-29 DIAGNOSIS — D519 Vitamin B12 deficiency anemia, unspecified: Secondary | ICD-10-CM | POA: Diagnosis not present

## 2020-05-29 DIAGNOSIS — E785 Hyperlipidemia, unspecified: Secondary | ICD-10-CM | POA: Diagnosis not present

## 2020-05-29 DIAGNOSIS — I5032 Chronic diastolic (congestive) heart failure: Secondary | ICD-10-CM | POA: Diagnosis not present

## 2020-05-29 DIAGNOSIS — Z6834 Body mass index (BMI) 34.0-34.9, adult: Secondary | ICD-10-CM | POA: Diagnosis not present

## 2020-05-29 DIAGNOSIS — I4891 Unspecified atrial fibrillation: Secondary | ICD-10-CM | POA: Diagnosis not present

## 2020-05-29 DIAGNOSIS — Z23 Encounter for immunization: Secondary | ICD-10-CM | POA: Diagnosis not present

## 2020-05-29 DIAGNOSIS — N184 Chronic kidney disease, stage 4 (severe): Secondary | ICD-10-CM | POA: Diagnosis not present

## 2020-05-29 DIAGNOSIS — N183 Chronic kidney disease, stage 3 unspecified: Secondary | ICD-10-CM | POA: Diagnosis not present

## 2020-05-29 DIAGNOSIS — I129 Hypertensive chronic kidney disease with stage 1 through stage 4 chronic kidney disease, or unspecified chronic kidney disease: Secondary | ICD-10-CM | POA: Diagnosis not present

## 2020-05-29 DIAGNOSIS — M5136 Other intervertebral disc degeneration, lumbar region: Secondary | ICD-10-CM | POA: Diagnosis not present

## 2020-05-29 DIAGNOSIS — E039 Hypothyroidism, unspecified: Secondary | ICD-10-CM | POA: Diagnosis not present

## 2020-06-06 DIAGNOSIS — M5136 Other intervertebral disc degeneration, lumbar region: Secondary | ICD-10-CM | POA: Diagnosis not present

## 2020-06-06 DIAGNOSIS — M545 Low back pain, unspecified: Secondary | ICD-10-CM | POA: Diagnosis not present

## 2020-06-11 ENCOUNTER — Other Ambulatory Visit: Payer: Self-pay | Admitting: Internal Medicine

## 2020-06-11 DIAGNOSIS — M5137 Other intervertebral disc degeneration, lumbosacral region: Secondary | ICD-10-CM

## 2020-06-13 DIAGNOSIS — M5136 Other intervertebral disc degeneration, lumbar region: Secondary | ICD-10-CM | POA: Diagnosis not present

## 2020-06-13 DIAGNOSIS — M545 Low back pain, unspecified: Secondary | ICD-10-CM | POA: Diagnosis not present

## 2020-07-01 DIAGNOSIS — M25551 Pain in right hip: Secondary | ICD-10-CM | POA: Diagnosis not present

## 2020-07-01 DIAGNOSIS — M545 Low back pain, unspecified: Secondary | ICD-10-CM | POA: Diagnosis not present

## 2020-07-02 DIAGNOSIS — M25551 Pain in right hip: Secondary | ICD-10-CM | POA: Diagnosis not present

## 2020-07-02 DIAGNOSIS — M545 Low back pain, unspecified: Secondary | ICD-10-CM | POA: Diagnosis not present

## 2020-07-05 DIAGNOSIS — M25551 Pain in right hip: Secondary | ICD-10-CM | POA: Diagnosis not present

## 2020-07-05 DIAGNOSIS — S32000A Wedge compression fracture of unspecified lumbar vertebra, initial encounter for closed fracture: Secondary | ICD-10-CM | POA: Diagnosis not present

## 2020-07-08 DIAGNOSIS — M545 Low back pain, unspecified: Secondary | ICD-10-CM | POA: Diagnosis not present

## 2020-07-08 DIAGNOSIS — M25551 Pain in right hip: Secondary | ICD-10-CM | POA: Diagnosis not present

## 2020-07-11 DIAGNOSIS — M5416 Radiculopathy, lumbar region: Secondary | ICD-10-CM | POA: Diagnosis not present

## 2020-07-11 DIAGNOSIS — M545 Low back pain, unspecified: Secondary | ICD-10-CM | POA: Diagnosis not present

## 2020-07-17 DIAGNOSIS — M7061 Trochanteric bursitis, right hip: Secondary | ICD-10-CM | POA: Diagnosis not present

## 2020-07-17 DIAGNOSIS — M545 Low back pain, unspecified: Secondary | ICD-10-CM | POA: Diagnosis not present

## 2020-07-17 DIAGNOSIS — M25551 Pain in right hip: Secondary | ICD-10-CM | POA: Diagnosis not present

## 2020-08-20 DIAGNOSIS — J208 Acute bronchitis due to other specified organisms: Secondary | ICD-10-CM | POA: Diagnosis not present

## 2020-08-20 DIAGNOSIS — R262 Difficulty in walking, not elsewhere classified: Secondary | ICD-10-CM | POA: Diagnosis not present

## 2020-08-20 DIAGNOSIS — M5137 Other intervertebral disc degeneration, lumbosacral region: Secondary | ICD-10-CM | POA: Diagnosis not present

## 2020-08-20 DIAGNOSIS — D519 Vitamin B12 deficiency anemia, unspecified: Secondary | ICD-10-CM | POA: Diagnosis not present

## 2020-08-20 DIAGNOSIS — F419 Anxiety disorder, unspecified: Secondary | ICD-10-CM | POA: Diagnosis not present

## 2020-08-20 DIAGNOSIS — B9689 Other specified bacterial agents as the cause of diseases classified elsewhere: Secondary | ICD-10-CM | POA: Diagnosis not present

## 2020-08-22 ENCOUNTER — Other Ambulatory Visit: Payer: Self-pay | Admitting: Cardiology

## 2020-08-22 ENCOUNTER — Ambulatory Visit: Payer: Medicare HMO | Admitting: Cardiology

## 2020-08-22 NOTE — Telephone Encounter (Signed)
Refill sent to pharmacy.   

## 2020-09-01 DIAGNOSIS — D638 Anemia in other chronic diseases classified elsewhere: Secondary | ICD-10-CM | POA: Diagnosis not present

## 2020-09-01 DIAGNOSIS — I5032 Chronic diastolic (congestive) heart failure: Secondary | ICD-10-CM | POA: Diagnosis not present

## 2020-09-01 DIAGNOSIS — N183 Chronic kidney disease, stage 3 unspecified: Secondary | ICD-10-CM | POA: Diagnosis not present

## 2020-09-01 DIAGNOSIS — D519 Vitamin B12 deficiency anemia, unspecified: Secondary | ICD-10-CM | POA: Diagnosis not present

## 2020-09-01 DIAGNOSIS — E785 Hyperlipidemia, unspecified: Secondary | ICD-10-CM | POA: Diagnosis not present

## 2020-09-01 DIAGNOSIS — N184 Chronic kidney disease, stage 4 (severe): Secondary | ICD-10-CM | POA: Diagnosis not present

## 2020-09-01 DIAGNOSIS — I4891 Unspecified atrial fibrillation: Secondary | ICD-10-CM | POA: Diagnosis not present

## 2020-09-01 DIAGNOSIS — I129 Hypertensive chronic kidney disease with stage 1 through stage 4 chronic kidney disease, or unspecified chronic kidney disease: Secondary | ICD-10-CM | POA: Diagnosis not present

## 2020-09-01 DIAGNOSIS — E039 Hypothyroidism, unspecified: Secondary | ICD-10-CM | POA: Diagnosis not present

## 2020-09-01 DIAGNOSIS — I130 Hypertensive heart and chronic kidney disease with heart failure and stage 1 through stage 4 chronic kidney disease, or unspecified chronic kidney disease: Secondary | ICD-10-CM | POA: Diagnosis not present

## 2020-09-01 DIAGNOSIS — I480 Paroxysmal atrial fibrillation: Secondary | ICD-10-CM | POA: Diagnosis not present

## 2020-09-10 DIAGNOSIS — D485 Neoplasm of uncertain behavior of skin: Secondary | ICD-10-CM | POA: Diagnosis not present

## 2020-09-10 DIAGNOSIS — L570 Actinic keratosis: Secondary | ICD-10-CM | POA: Diagnosis not present

## 2020-09-10 DIAGNOSIS — C44629 Squamous cell carcinoma of skin of left upper limb, including shoulder: Secondary | ICD-10-CM | POA: Diagnosis not present

## 2020-09-15 DIAGNOSIS — T7840XA Allergy, unspecified, initial encounter: Secondary | ICD-10-CM | POA: Diagnosis not present

## 2020-09-17 ENCOUNTER — Ambulatory Visit: Payer: Medicare HMO | Admitting: Cardiology

## 2020-09-20 ENCOUNTER — Telehealth: Payer: Self-pay | Admitting: Cardiology

## 2020-09-22 DIAGNOSIS — C44629 Squamous cell carcinoma of skin of left upper limb, including shoulder: Secondary | ICD-10-CM | POA: Diagnosis not present

## 2020-09-22 NOTE — Telephone Encounter (Signed)
Yes please reduce her Eliquis dose to 2.5mg  BID and let her know. Her SCr has consistently been > 1.5 since 2019, I am not sure why her cardiologist prescribed her the 5mg  dose.

## 2020-09-22 NOTE — Telephone Encounter (Signed)
Prescription refill request for Eliquis received. Indication: atrial fibrillation Last office visit:9/21  [place] Scr:2.2 08/2020 Age: 85 Weight: 94.2 kg  May need to lower dose to Eliquis 2.5 bid..  Please review

## 2020-09-24 ENCOUNTER — Encounter: Payer: Self-pay | Admitting: Cardiology

## 2020-09-24 MED ORDER — APIXABAN 2.5 MG PO TABS: 2.5000 mg | ORAL_TABLET | Freq: Two times a day (BID) | ORAL | 12 refills | Status: DC

## 2020-09-24 NOTE — Addendum Note (Signed)
Addended by: Truddie Hidden on: 09/24/2020 09:45 AM   Modules accepted: Orders

## 2020-09-24 NOTE — Telephone Encounter (Signed)
Corrected medication prescription sent to [facility].

## 2020-09-24 NOTE — Telephone Encounter (Signed)
Pharmacy called in and stated that are needing this med called in with different instructions   " Take 2 tablets (5 mg total) by mouth 2 (two) times daily."  They stated this was not clear from ins proposes

## 2020-10-06 DIAGNOSIS — B9689 Other specified bacterial agents as the cause of diseases classified elsewhere: Secondary | ICD-10-CM | POA: Diagnosis not present

## 2020-10-06 DIAGNOSIS — J019 Acute sinusitis, unspecified: Secondary | ICD-10-CM | POA: Diagnosis not present

## 2020-10-06 DIAGNOSIS — S81802A Unspecified open wound, left lower leg, initial encounter: Secondary | ICD-10-CM | POA: Diagnosis not present

## 2020-10-07 NOTE — Progress Notes (Unsigned)
Cardiology Office Note:    Date:  10/08/2020   ID:  Melanie Montes, DOB Jun 04, 1936, MRN [id number]  PCP:  Nicoletta Dress, MD  Cardiologist:  Shirlee More, MD    Referring MD: Nicoletta Dress, MD    ASSESSMENT:    1. Persistent atrial fibrillation ([place])   2. On amiodarone therapy   3. Chronic anticoagulation   4. Hypertensive heart and chronic kidney disease with heart failure and stage 1 through stage 4 chronic kidney disease, or unspecified chronic kidney disease ([place])    PLAN:    In order of problems listed above:  1. She continues in atrial fibrillation/atypical atrial flutter controlled rate on beta-blocker stop amiodarone wait 2 weeks and apply monitor 3 days assess heart rate response and continue her anticoagulant reduced dose.  I think at this point in time the risk of amiodarone exceeds benefit. 2. Continue current anticoagulant 3. Stable BP at target stable kidney disease she has no evidence of fluid overload on her current diuretic continue the same   Next appointment: 6 months   Medication Adjustments/Labs and Tests Ordered: Current medicines are reviewed at length with the patient today.  Concerns regarding medicines are outlined above.  No orders of the defined types were placed in this encounter.  No orders of the defined types were placed in this encounter.   Chief Complaint  Patient presents with  . Follow-up  . Atrial Fibrillation  . Congestive Heart Failure  . Hypertension    History of Present Illness:    Melanie Montes is a 85 y.o. female with a hx of hypertensive heart disease with heart failure, atrial fibrillation last seen 04/22/2020. Compliance with diet, lifestyle and medications: Yes  Her predominant complaint is fragile skin excoriation her right leg that she is seeing her PCP for. She is not having edema she has mild exertional shortness of breath not severe or limiting no orthopnea chest pain palpitation or syncope. She  continues in rate controlled atrial flutter on amiodarone and I would discontinue at this time as she is decided not to have cardioversion. Recent labs PCP 09/01/2020 showed lipids at target cholesterol 153 LDL 86 triglycerides 106 HDL 48 creatinine 2.24 TSH normal 0.126 Past Medical History:  Diagnosis Date  . Abnormal CT scan, chest 05/02/2012   Followed in Pulmonary clinic/ [facility]/ [place]    - CT Chest 04/17/12:  Bilateral clustered nodules both upper lobes and sup segment of lower lobes     . Anemia, pernicious   . Breast cancer ([place])    Left  . Breast cancer of upper-outer quadrant of left female breast ([place]) 01/19/2016  . Carotid artery occlusion without infarction   . Carotid stenosis 05/17/2018  . Chest pain in adult 06/30/2015   Overview:  Overview:  Stress echo negative for ischemia at 7 mets July 2016 Overview:  Stress echo negative for ischemia at 7 mets July 2016  . Chronic diastolic heart failure ([place]) 06/30/2015  . CKD (chronic kidney disease) 06/30/2015  . Colonic polyp   . COPD (chronic obstructive pulmonary disease) ([place]) 06/02/2012   Followed in Pulmonary clinic/ [facility]/ [place]    - PFT's 06/02/2012 FEV1  1.72 (86%) ratio 68 with marked air trapping  And DLCO 67%   . Cough 05/02/2012   Followed in Pulmonary clinic/ [facility]/ [place]    . Degeneration of lumbar or lumbosacral intervertebral disc   . Edema   . Fibrocystic breast changes, bilateral 01/23/2018  . GERD (  gastroesophageal reflux disease)   . HTN (hypertension), benign   . Hyperlipidemia   . Hypertensive heart and chronic kidney disease with heart failure and stage 1 through stage 4 chronic kidney disease, or unspecified chronic kidney disease ([place]) 06/30/2015  . Hypothyroidism   . Paroxysmal atrial fibrillation (H[facility])   . PAT (paroxysmal atrial tachycardia) ([place]) 12/13/2017  . Restless legs     Past Surgical History:  Procedure Laterality Date  . ABDOMINAL HYSTERECTOMY  1970  . BREAST  MASS EXCISION  2009   left//malignant  . CARDIOVERSION N/A 03/09/2018   Procedure: CARDIOVERSION;  Surgeon: Larey Dresser, MD;  Location: [facility] ENDOSCOPY;  Service: Cardiovascular;  Laterality: N/A;  . CARDIOVERSION N/A 08/04/2018   Procedure: CARDIOVERSION;  Surgeon: Sueanne Margarita, MD;  Location: [facility] ENDOSCOPY;  Service: Cardiovascular;  Laterality: N/A;  . COLONOSCOPY  03/27/2020  . SPINE SURGERY  1980   lumbar    Current Medications: Current Meds  Medication Sig  . Albuterol Sulfate (PROAIR RESPICLICK) 381 (90 Base) MCG/ACT AEPB Inhale 2 puffs into the lungs every 6 (six) hours as needed (shortness of breath).   . ALPRAZolam (XANAX) 0.25 MG tablet Take 0.25 mg by mouth daily as needed for anxiety.   Marland Kitchen amiodarone (PACERONE) 200 MG tablet TAKE 1 TABLET BY MOUTH TWICE A DAY.  Marland Kitchen amLODipine (NORVASC) 5 MG tablet Take 1 tablet (5 mg total) by mouth daily.  Marland Kitchen apixaban (ELIQUIS) 2.5 MG TABS tablet Take 1 tablet (2.5 mg total) by mouth 2 (two) times daily.  Marland Kitchen diltiazem (TIAZAC) 240 MG 24 hr capsule Take by mouth.  . ezetimibe (ZETIA) 10 MG tablet Take 10 mg by mouth daily.  . famotidine (PEPCID) 40 MG tablet Take 40 mg by mouth daily.  . fluorouracil (EFUDEX) 5 % cream in the morning and at bedtime.  . gabapentin (NEURONTIN) 300 MG capsule Take 300 mg by mouth at bedtime.  . Glucosamine-Chondroit-Vit C-Mn (GLUCOSAMINE 1500 COMPLEX PO) Take 1,500 mg by mouth 2 (two) times daily.   Marland Kitchen HYDROcodone-acetaminophen (NORCO/VICODIN) 5-325 MG tablet Take 1 tablet by mouth every 6 (six) hours as needed.   . hydrOXYzine (ATARAX/VISTARIL) 25 MG tablet   . isosorbide mononitrate (IMDUR) 30 MG 24 hr tablet TAKE 1 TABLET BY MOUTH DAILY  . levothyroxine (SYNTHROID) 50 MCG tablet Take 50 mcg by mouth daily.  . metoprolol succinate (TOPROL-XL) 100 MG 24 hr tablet Take 50 mg by mouth 2 (two) times daily.  . niacin 500 MG tablet Take 1,000 mg by mouth at bedtime.   . nitroGLYCERIN (NITROSTAT) 0.4 MG SL tablet  Place 1 tablet (0.4 mg total) under the tongue every 5 (five) minutes as needed.  . Omega-3 Fatty Acids (FISH OIL) 1200 MG CAPS Take 1,200 mg by mouth 2 (two) times daily.   . potassium chloride (MICRO-K) 10 MEQ CR capsule Take 10 mEq by mouth daily.  . Red Yeast Rice 600 MG TABS Take 600 mg by mouth 2 (two) times daily.   Marland Kitchen torsemide (DEMADEX) 20 MG tablet TAKE 1 TABLET BY MOUTH 2 TIMES DAILY.  . vitamin E 1000 UNIT capsule Take 1,000 Units by mouth daily.  Earnestine Mealing 625 MG tablet Take 1,875 mg by mouth 2 (two) times daily.     Allergies:   Penicillins and Statins   Social History   Socioeconomic History  . Marital status: Unknown    Spouse name: Not on file  . Number of children: Not on file  . Years of education: Not on  file  . Highest education level: Not on file  Occupational History  . Occupation: retired  Tobacco Use  . Smoking status: Former Smoker    Packs/day: 1.00    Years: 20.00    Pack years: 20.00    Types: Cigarettes    Quit date: 07/26/2000    Years since quitting: 20.2  . Smokeless tobacco: Never Used  Vaping Use  . Vaping Use: Never used  Substance and Sexual Activity  . Alcohol use: No  . Drug use: No  . Sexual activity: Not on file  Other Topics Concern  . Not on file  Social History Narrative   ** Merged History Encounter **       Social Determinants of Health   Financial Resource Strain: Not on file  Food Insecurity: Not on file  Transportation Needs: Not on file  Physical Activity: Not on file  Stress: Not on file  Social Connections: Not on file     Family History: The patient's family history includes Alzheimer's disease in her mother; Aneurysm in her mother; Colon cancer in her father and maternal aunt; Heart disease in her maternal grandmother and paternal grandmother; Stomach cancer in her paternal grandmother; Stroke in her mother; Transient ischemic attack in her father. There is no history of Esophageal cancer or Ulcerative  colitis. ROS:   Please see the history of present illness.    All other systems reviewed and are negative.  EKGs/Labs/Other Studies Reviewed:    The following studies were reviewed today:  EKG:  EKG ordered today and personally reviewed.  The ekg ordered today demonstrates atypical atrial flutter controlled ventricular rate low voltage  Recent Labs: No results found for requested labs within last 8760 hours.  Recent Lipid Panel No results found for: CHOL, TRIG, HDL, CHOLHDL, VLDL, LDLCALC, LDLDIRECT  Physical Exam:    VS:  BP 124/70   Pulse 95   Ht 5\' 4"  (1.626 m)   Wt 193 lb 3.2 oz (87.6 kg)   SpO2 96%   BMI 33.16 kg/m     Wt Readings from Last 3 Encounters:  10/08/20 193 lb 3.2 oz (87.6 kg)  04/22/20 207 lb 9.6 oz (94.2 kg)  03/27/20 205 lb (93 kg)     GEN: Appears her age well nourished, well developed in no acute distress HEENT: Normal NECK: No JVD; No carotid bruits LYMPHATICS: No lymphadenopathy CARDIAC: S1 variable irregular rhythm , no murmurs, rubs, gallops RESPIRATORY:  Clear to auscultation without rales, wheezing or rhonchi  ABDOMEN: Soft, non-tender, non-distended MUSCULOSKELETAL:  No edema; No deformity  SKIN: Warm and dry NEUROLOGIC:  Alert and oriented x 3 PSYCHIATRIC:  Normal affect    Signed, Shirlee More, MD  10/08/2020 10:28 AM    [facility]

## 2020-10-08 ENCOUNTER — Ambulatory Visit (INDEPENDENT_AMBULATORY_CARE_PROVIDER_SITE_OTHER): Payer: Medicare HMO

## 2020-10-08 ENCOUNTER — Encounter: Payer: Self-pay | Admitting: Cardiology

## 2020-10-08 ENCOUNTER — Other Ambulatory Visit: Payer: Self-pay

## 2020-10-08 ENCOUNTER — Ambulatory Visit: Payer: Medicare HMO | Admitting: Cardiology

## 2020-10-08 VITALS — BP 124/70 | HR 95 | Ht 64.0 in | Wt 193.2 lb

## 2020-10-08 DIAGNOSIS — Z79899 Other long term (current) drug therapy: Secondary | ICD-10-CM | POA: Diagnosis not present

## 2020-10-08 DIAGNOSIS — Z7901 Long term (current) use of anticoagulants: Secondary | ICD-10-CM | POA: Diagnosis not present

## 2020-10-08 DIAGNOSIS — I130 Hypertensive heart and chronic kidney disease with heart failure and stage 1 through stage 4 chronic kidney disease, or unspecified chronic kidney disease: Secondary | ICD-10-CM | POA: Diagnosis not present

## 2020-10-08 DIAGNOSIS — I484 Atypical atrial flutter: Secondary | ICD-10-CM

## 2020-10-08 DIAGNOSIS — I4819 Other persistent atrial fibrillation: Secondary | ICD-10-CM

## 2020-10-08 NOTE — Patient Instructions (Signed)
Medication Instructions:  Your physician has recommended you make the following change in your medication:  STOP: Amiodarone *If you need a refill on your cardiac medications before your next appointment, please call your pharmacy*   Lab Work: None If you have labs (blood work) drawn today and your tests are completely normal, you will receive your results only by: Marland Kitchen MyChart Message (if you have MyChart) OR . A paper copy in the mail If you have any lab test that is abnormal or we need to change your treatment, we will call you to review the results.   Testing/Procedures: A zio monitor was ordered today. It will remain on for 3 days. You will then return monitor and event diary in provided box. It takes 1-2 weeks for report to be downloaded and returned to [place]. We will call you with the results. If monitor falls off or has orange flashing light, please call Zio for further instructions. PLEASE BEGIN WEARING THIS MONITOR IN TWO WEEKS.      Follow-Up: At [facility], you and your health needs are our priority.  As part of our continuing mission to provide you with exceptional heart care, we have created designated Provider Care Teams.  These Care Teams include your primary Cardiologist (physician) and Advanced Practice Providers (APPs -  Physician Assistants and Nurse Practitioners) who all work together to provide you with the care you need, when you need it.  We recommend signing up for the patient portal called "MyChart".  Sign up information is provided on this After Visit Summary.  MyChart is used to connect with patients for Virtual Visits (Telemedicine).  Patients are able to view lab/test results, encounter notes, upcoming appointments, etc.  Non-urgent messages can be sent to your provider as well.   To learn more about what you can do with MyChart, go to [email or web address].    Your next appointment:   6 month(s)  The format for your next appointment:   In  Person  Provider:   Shirlee More, MD   Other Instructions

## 2020-10-08 NOTE — Addendum Note (Signed)
Addended by: Resa Miner I on: 10/08/2020 10:34 AM   Modules accepted: Orders

## 2020-10-20 DIAGNOSIS — I4819 Other persistent atrial fibrillation: Secondary | ICD-10-CM

## 2020-10-20 DIAGNOSIS — I484 Atypical atrial flutter: Secondary | ICD-10-CM | POA: Diagnosis not present

## 2020-10-27 DIAGNOSIS — N184 Chronic kidney disease, stage 4 (severe): Secondary | ICD-10-CM | POA: Diagnosis not present

## 2020-10-27 DIAGNOSIS — N390 Urinary tract infection, site not specified: Secondary | ICD-10-CM | POA: Diagnosis not present

## 2020-10-28 DIAGNOSIS — I4819 Other persistent atrial fibrillation: Secondary | ICD-10-CM | POA: Diagnosis not present

## 2020-10-28 DIAGNOSIS — I484 Atypical atrial flutter: Secondary | ICD-10-CM | POA: Diagnosis not present

## 2020-11-03 ENCOUNTER — Telehealth: Payer: Self-pay

## 2020-11-03 NOTE — Telephone Encounter (Signed)
Tried calling patient. No answer and no voicemail set up for me to leave a message. 

## 2020-11-03 NOTE — Telephone Encounter (Signed)
-----   Message from Richardo Priest, MD sent at 11/01/2020  3:51 PM EDT ----- Good report stay off amiodarone

## 2020-11-04 ENCOUNTER — Telehealth: Payer: Self-pay

## 2020-11-04 NOTE — Telephone Encounter (Signed)
Tried calling patient. No answer and no voicemail set up for me to leave a message. 

## 2020-11-04 NOTE — Telephone Encounter (Signed)
-----   Message from Richardo Priest, MD sent at 11/01/2020  3:51 PM EDT ----- Good report stay off amiodarone

## 2020-11-05 ENCOUNTER — Telehealth: Payer: Self-pay

## 2020-11-05 NOTE — Telephone Encounter (Signed)
-----   Message from Richardo Priest, MD sent at 11/01/2020  3:51 PM EDT ----- Good report stay off amiodarone

## 2020-11-05 NOTE — Telephone Encounter (Signed)
Spoke with patient regarding results and recommendation.  Patient verbalizes understanding and is agreeable to plan of care. Advised patient to call back with any issues or concerns.  

## 2020-11-06 DIAGNOSIS — Z85828 Personal history of other malignant neoplasm of skin: Secondary | ICD-10-CM | POA: Diagnosis not present

## 2020-11-06 DIAGNOSIS — L905 Scar conditions and fibrosis of skin: Secondary | ICD-10-CM | POA: Diagnosis not present

## 2020-12-03 DIAGNOSIS — N184 Chronic kidney disease, stage 4 (severe): Secondary | ICD-10-CM | POA: Diagnosis not present

## 2020-12-03 DIAGNOSIS — I480 Paroxysmal atrial fibrillation: Secondary | ICD-10-CM | POA: Diagnosis not present

## 2020-12-03 DIAGNOSIS — I4891 Unspecified atrial fibrillation: Secondary | ICD-10-CM | POA: Diagnosis not present

## 2020-12-03 DIAGNOSIS — M5136 Other intervertebral disc degeneration, lumbar region: Secondary | ICD-10-CM | POA: Diagnosis not present

## 2020-12-03 DIAGNOSIS — J449 Chronic obstructive pulmonary disease, unspecified: Secondary | ICD-10-CM | POA: Diagnosis not present

## 2020-12-03 DIAGNOSIS — I130 Hypertensive heart and chronic kidney disease with heart failure and stage 1 through stage 4 chronic kidney disease, or unspecified chronic kidney disease: Secondary | ICD-10-CM | POA: Diagnosis not present

## 2020-12-03 DIAGNOSIS — D638 Anemia in other chronic diseases classified elsewhere: Secondary | ICD-10-CM | POA: Diagnosis not present

## 2020-12-03 DIAGNOSIS — D519 Vitamin B12 deficiency anemia, unspecified: Secondary | ICD-10-CM | POA: Diagnosis not present

## 2020-12-03 DIAGNOSIS — I5032 Chronic diastolic (congestive) heart failure: Secondary | ICD-10-CM | POA: Diagnosis not present

## 2020-12-03 DIAGNOSIS — E785 Hyperlipidemia, unspecified: Secondary | ICD-10-CM | POA: Diagnosis not present

## 2020-12-03 DIAGNOSIS — E039 Hypothyroidism, unspecified: Secondary | ICD-10-CM | POA: Diagnosis not present

## 2020-12-10 DIAGNOSIS — D631 Anemia in chronic kidney disease: Secondary | ICD-10-CM | POA: Diagnosis not present

## 2020-12-10 DIAGNOSIS — N189 Chronic kidney disease, unspecified: Secondary | ICD-10-CM | POA: Diagnosis not present

## 2020-12-10 DIAGNOSIS — R609 Edema, unspecified: Secondary | ICD-10-CM | POA: Diagnosis not present

## 2020-12-10 DIAGNOSIS — I129 Hypertensive chronic kidney disease with stage 1 through stage 4 chronic kidney disease, or unspecified chronic kidney disease: Secondary | ICD-10-CM | POA: Diagnosis not present

## 2020-12-10 DIAGNOSIS — I5032 Chronic diastolic (congestive) heart failure: Secondary | ICD-10-CM | POA: Diagnosis not present

## 2020-12-10 DIAGNOSIS — E785 Hyperlipidemia, unspecified: Secondary | ICD-10-CM | POA: Diagnosis not present

## 2020-12-10 DIAGNOSIS — N184 Chronic kidney disease, stage 4 (severe): Secondary | ICD-10-CM | POA: Diagnosis not present

## 2020-12-10 DIAGNOSIS — N2581 Secondary hyperparathyroidism of renal origin: Secondary | ICD-10-CM | POA: Diagnosis not present

## 2021-01-12 DIAGNOSIS — S29019A Strain of muscle and tendon of unspecified wall of thorax, initial encounter: Secondary | ICD-10-CM | POA: Diagnosis not present

## 2021-01-12 DIAGNOSIS — I6523 Occlusion and stenosis of bilateral carotid arteries: Secondary | ICD-10-CM | POA: Diagnosis not present

## 2021-01-12 DIAGNOSIS — G2581 Restless legs syndrome: Secondary | ICD-10-CM | POA: Diagnosis not present

## 2021-01-12 DIAGNOSIS — I130 Hypertensive heart and chronic kidney disease with heart failure and stage 1 through stage 4 chronic kidney disease, or unspecified chronic kidney disease: Secondary | ICD-10-CM | POA: Diagnosis not present

## 2021-01-14 DIAGNOSIS — I6523 Occlusion and stenosis of bilateral carotid arteries: Secondary | ICD-10-CM | POA: Diagnosis not present

## 2021-01-14 DIAGNOSIS — I771 Stricture of artery: Secondary | ICD-10-CM | POA: Diagnosis not present

## 2021-01-15 DIAGNOSIS — N184 Chronic kidney disease, stage 4 (severe): Secondary | ICD-10-CM | POA: Diagnosis not present

## 2021-01-15 DIAGNOSIS — N1330 Unspecified hydronephrosis: Secondary | ICD-10-CM | POA: Diagnosis not present

## 2021-02-09 ENCOUNTER — Other Ambulatory Visit: Payer: Self-pay | Admitting: Nephrology

## 2021-02-09 DIAGNOSIS — N1330 Unspecified hydronephrosis: Secondary | ICD-10-CM

## 2021-02-26 DIAGNOSIS — H04123 Dry eye syndrome of bilateral lacrimal glands: Secondary | ICD-10-CM | POA: Diagnosis not present

## 2021-02-26 DIAGNOSIS — H2513 Age-related nuclear cataract, bilateral: Secondary | ICD-10-CM | POA: Diagnosis not present

## 2021-02-27 DIAGNOSIS — Z1231 Encounter for screening mammogram for malignant neoplasm of breast: Secondary | ICD-10-CM | POA: Diagnosis not present

## 2021-03-05 DIAGNOSIS — Z1331 Encounter for screening for depression: Secondary | ICD-10-CM | POA: Diagnosis not present

## 2021-03-05 DIAGNOSIS — Z9181 History of falling: Secondary | ICD-10-CM | POA: Diagnosis not present

## 2021-03-05 DIAGNOSIS — Z139 Encounter for screening, unspecified: Secondary | ICD-10-CM | POA: Diagnosis not present

## 2021-03-05 DIAGNOSIS — Z0000 Encounter for general adult medical examination without abnormal findings: Secondary | ICD-10-CM | POA: Diagnosis not present

## 2021-03-05 DIAGNOSIS — E785 Hyperlipidemia, unspecified: Secondary | ICD-10-CM | POA: Diagnosis not present

## 2021-03-10 DIAGNOSIS — I4891 Unspecified atrial fibrillation: Secondary | ICD-10-CM | POA: Diagnosis not present

## 2021-03-10 DIAGNOSIS — D519 Vitamin B12 deficiency anemia, unspecified: Secondary | ICD-10-CM | POA: Diagnosis not present

## 2021-03-10 DIAGNOSIS — M5136 Other intervertebral disc degeneration, lumbar region: Secondary | ICD-10-CM | POA: Diagnosis not present

## 2021-03-10 DIAGNOSIS — I5032 Chronic diastolic (congestive) heart failure: Secondary | ICD-10-CM | POA: Diagnosis not present

## 2021-03-10 DIAGNOSIS — E039 Hypothyroidism, unspecified: Secondary | ICD-10-CM | POA: Diagnosis not present

## 2021-03-10 DIAGNOSIS — E785 Hyperlipidemia, unspecified: Secondary | ICD-10-CM | POA: Diagnosis not present

## 2021-03-10 DIAGNOSIS — D638 Anemia in other chronic diseases classified elsewhere: Secondary | ICD-10-CM | POA: Diagnosis not present

## 2021-03-10 DIAGNOSIS — I130 Hypertensive heart and chronic kidney disease with heart failure and stage 1 through stage 4 chronic kidney disease, or unspecified chronic kidney disease: Secondary | ICD-10-CM | POA: Diagnosis not present

## 2021-03-10 DIAGNOSIS — N184 Chronic kidney disease, stage 4 (severe): Secondary | ICD-10-CM | POA: Diagnosis not present

## 2021-03-24 DIAGNOSIS — Z170 Estrogen receptor positive status [ER+]: Secondary | ICD-10-CM | POA: Diagnosis not present

## 2021-03-24 DIAGNOSIS — C50412 Malignant neoplasm of upper-outer quadrant of left female breast: Secondary | ICD-10-CM | POA: Diagnosis not present

## 2021-04-01 DIAGNOSIS — I878 Other specified disorders of veins: Secondary | ICD-10-CM | POA: Diagnosis not present

## 2021-04-01 DIAGNOSIS — M5136 Other intervertebral disc degeneration, lumbar region: Secondary | ICD-10-CM | POA: Diagnosis not present

## 2021-04-01 DIAGNOSIS — N1330 Unspecified hydronephrosis: Secondary | ICD-10-CM | POA: Diagnosis not present

## 2021-04-01 DIAGNOSIS — N3289 Other specified disorders of bladder: Secondary | ICD-10-CM | POA: Diagnosis not present

## 2021-04-03 ENCOUNTER — Other Ambulatory Visit: Payer: Self-pay | Admitting: Cardiology

## 2021-04-05 NOTE — Progress Notes (Signed)
Cardiology Office Note:    Date:  04/06/2021   ID:  CRYSTA Montes, DOB 1936-01-24, MRN [id number]  PCP:  Nicoletta Dress, MD  Cardiologist:  Shirlee More, MD    Referring MD: Nicoletta Dress, MD    ASSESSMENT:    1. Longstanding persistent atrial fibrillation ([place])   2. Chronic anticoagulation   3. Hypertensive heart and chronic kidney disease with heart failure and stage 1 through stage 4 chronic kidney disease, or unspecified chronic kidney disease ([place])    PLAN:    In order of problems listed above:  She continues to do well she has persistent atrial fibrillation rate controlled with calcium channel blocker beta-blocker and anticoagulated with reduced dose anticoagulant on the basis of age and kidney function.  No longer on amiodarone Continue her anticoagulant she has had no bleeding complication moderate stroke risk Stable BP at target heart failure compensated continue current loop diuretic and stable CKD   Next appointment: 1 year   Medication Adjustments/Labs and Tests Ordered: Current medicines are reviewed at length with the patient today.  Concerns regarding medicines are outlined above.  No orders of the defined types were placed in this encounter.  No orders of the defined types were placed in this encounter.   Chief Complaint  Patient presents with   Follow-up   Atrial Fibrillation     History of Present Illness:    Melanie Montes is a 85 y.o. female with a hx of persistent atrial fibrillation on amiodarone therapy chronic anticoagulation and hypertensive heart and chronic kidney disease with heart failure last seen 10/08/2020. Compliance with diet, lifestyle and medications: Yes  Most recent labs 03/10/2021 cholesterol 143 LDL 88 triglycerides 87 HDL 43 hemoglobin 13.1 creatinine 1.75 potassium 3.6 She had a recent carotid duplex from 01/14/2021 with moderate plaque but no hemodynamically significant stenosis in the carotids and a renal vascular  duplex with mild hydronephrosis otherwise normal.` Off amiodarone she notices no difference unaware of atrial fibrillation no palpitations syncope edema shortness of breath or chest pain. She tolerates her anticoagulant without bleeding She has no muscle pain or weakness from over-the-counter niacin and red rice yeast.  She used an event monitor for 3 days reported 11/01/2020 which showed persistent atrial fibrillation with good heart rate control there are no bradycardic episodes or pauses of 3 seconds or greater and she was recommended to stay off amiodarone. Past Medical History:  Diagnosis Date   Abnormal CT scan, chest 05/02/2012   Followed in Pulmonary clinic/ [facility]/ [place]    - CT Chest 04/17/12:  Bilateral clustered nodules both upper lobes and sup segment of lower lobes      Anemia, pernicious    Breast cancer ([place])    Left   Breast cancer of upper-outer quadrant of left female breast ([place]) 01/19/2016   Carotid artery occlusion without infarction    Carotid stenosis 05/17/2018   Chest pain in adult 06/30/2015   Overview:  Overview:  Stress echo negative for ischemia at 7 mets July 2016 Overview:  Stress echo negative for ischemia at 7 mets July 2016   Chronic diastolic heart failure ([place]) 06/30/2015   CKD (chronic kidney disease) 06/30/2015   Colonic polyp    COPD (chronic obstructive pulmonary disease) ([place]) 06/02/2012   Followed in Pulmonary clinic/ [facility]/ [place]    - PFT's 06/02/2012 FEV1  1.72 (86%) ratio 68 with marked air trapping  And DLCO 67%    Cough 05/02/2012   Followed in  Pulmonary clinic/ [facility]/ [place]     Degeneration of lumbar or lumbosacral intervertebral disc    Edema    Fibrocystic breast changes, bilateral 01/23/2018   GERD (gastroesophageal reflux disease)    HTN (hypertension), benign    Hyperlipidemia    Hypertensive heart and chronic kidney disease with heart failure and stage 1 through stage 4 chronic kidney disease, or unspecified  chronic kidney disease ([place]) 06/30/2015   Hypothyroidism    Paroxysmal atrial fibrillation ([facility])    PAT (paroxysmal atrial tachycardia) ([place]) 12/13/2017   Restless legs     Past Surgical History:  Procedure Laterality Date   ABDOMINAL HYSTERECTOMY  1970   BREAST MASS EXCISION  2009   left//malignant   CARDIOVERSION N/A 03/09/2018   Procedure: CARDIOVERSION;  Surgeon: Larey Dresser, MD;  Location: [facility];  Service: Cardiovascular;  Laterality: N/A;   CARDIOVERSION N/A 08/04/2018   Procedure: CARDIOVERSION;  Surgeon: Sueanne Margarita, MD;  Location: [facility];  Service: Cardiovascular;  Laterality: N/A;   COLONOSCOPY  03/27/2020   SPINE SURGERY  1980   lumbar    Current Medications: Current Meds  Medication Sig   Albuterol Sulfate (PROAIR RESPICLICK) 073 (90 Base) MCG/ACT AEPB Inhale 2 puffs into the lungs every 6 (six) hours as needed (shortness of breath).    ALPRAZolam (XANAX) 0.25 MG tablet Take 0.25 mg by mouth daily as needed for anxiety.    amLODipine (NORVASC) 5 MG tablet Take 1 tablet (5 mg total) by mouth daily.   apixaban (ELIQUIS) 2.5 MG TABS tablet Take 1 tablet (2.5 mg total) by mouth 2 (two) times daily.   diltiazem (TIAZAC) 240 MG 24 hr capsule Take by mouth.   ezetimibe (ZETIA) 10 MG tablet Take 10 mg by mouth daily.   famotidine (PEPCID) 40 MG tablet Take 40 mg by mouth daily.   fluorouracil (EFUDEX) 5 % cream in the morning and at bedtime.   gabapentin (NEURONTIN) 300 MG capsule Take 300 mg by mouth at bedtime.   Glucosamine-Chondroit-Vit C-Mn (GLUCOSAMINE 1500 COMPLEX PO) Take 1,500 mg by mouth 2 (two) times daily.    HYDROcodone-acetaminophen (NORCO/VICODIN) 5-325 MG tablet Take 1 tablet by mouth every 6 (six) hours as needed for moderate pain or severe pain.   hydrOXYzine (ATARAX/VISTARIL) 25 MG tablet 25 mg every 6 (six) hours as needed for anxiety.   isosorbide mononitrate (IMDUR) 30 MG 24 hr tablet TAKE 1 TABLET BY MOUTH DAILY   levothyroxine  (SYNTHROID) 50 MCG tablet Take 50 mcg by mouth daily.   metoprolol succinate (TOPROL-XL) 100 MG 24 hr tablet Take 50 mg by mouth 2 (two) times daily.   niacin 500 MG tablet Take 1,000 mg by mouth at bedtime.    nitroGLYCERIN (NITROSTAT) 0.4 MG SL tablet Place 0.4 mg under the tongue as needed for chest pain.   Omega-3 Fatty Acids (FISH OIL) 1200 MG CAPS Take 1,200 mg by mouth 2 (two) times daily.    potassium chloride (MICRO-K) 10 MEQ CR capsule Take 10 mEq by mouth daily.   Red Yeast Rice 600 MG TABS Take 600 mg by mouth 2 (two) times daily.    torsemide (DEMADEX) 20 MG tablet TAKE 1 TABLET BY MOUTH 2 TIMES DAILY.   vitamin E 1000 UNIT capsule Take 1,000 Units by mouth daily.   WELCHOL 625 MG tablet Take 1,875 mg by mouth 2 (two) times daily.     Allergies:   Penicillins and Statins   Social History   Socioeconomic History  Marital status: Unknown    Spouse name: Not on file   Number of children: Not on file   Years of education: Not on file   Highest education level: Not on file  Occupational History   Occupation: retired  Tobacco Use   Smoking status: Former    Packs/day: 1.00    Years: 20.00    Pack years: 20.00    Types: Cigarettes    Quit date: 07/26/2000    Years since quitting: 20.7   Smokeless tobacco: Never  Vaping Use   Vaping Use: Never used  Substance and Sexual Activity   Alcohol use: No   Drug use: No   Sexual activity: Not on file  Other Topics Concern   Not on file  Social History Narrative   ** Merged History Encounter **       Social Determinants of Health   Financial Resource Strain: Not on file  Food Insecurity: Not on file  Transportation Needs: Not on file  Physical Activity: Not on file  Stress: Not on file  Social Connections: Not on file     Family History: The patient's family history includes Alzheimer's disease in her mother; Aneurysm in her mother; Colon cancer in her father and maternal aunt; Heart disease in her maternal  grandmother and paternal grandmother; Stomach cancer in her paternal grandmother; Stroke in her mother; Transient ischemic attack in her father. There is no history of Esophageal cancer or Ulcerative colitis. ROS:   Please see the history of present illness.    All other systems reviewed and are negative.  EKGs/Labs/Other Studies Reviewed:    The following studies were reviewed today:    Physical Exam:    VS:  BP 132/64 (BP Location: Right Arm, Patient Position: Sitting, Cuff Size: Normal)   Pulse 92   Ht 5\' 4"  (1.626 m)   Wt 171 lb 12.8 oz (77.9 kg)   SpO2 96%   BMI 29.49 kg/m     Wt Readings from Last 3 Encounters:  04/06/21 171 lb 12.8 oz (77.9 kg)  10/08/20 193 lb 3.2 oz (87.6 kg)  04/22/20 207 lb 9.6 oz (94.2 kg)     GEN:  Well nourished, well developed in no acute distress HEENT: Normal NECK: No JVD; No carotid bruits LYMPHATICS: No lymphadenopathy CARDIAC: Irregular rate and rhythm RRR, no murmurs, rubs, gallops RESPIRATORY:  Clear to auscultation without rales, wheezing or rhonchi  ABDOMEN: Soft, non-tender, non-distended MUSCULOSKELETAL:  No edema; No deformity  SKIN: Warm and dry NEUROLOGIC:  Alert and oriented x 3 PSYCHIATRIC:  Normal affect    Signed, Shirlee More, MD  04/06/2021 2:56 PM    [place]

## 2021-04-06 ENCOUNTER — Other Ambulatory Visit: Payer: Self-pay

## 2021-04-06 ENCOUNTER — Encounter: Payer: Self-pay | Admitting: Cardiology

## 2021-04-06 ENCOUNTER — Ambulatory Visit: Payer: Medicare HMO | Admitting: Cardiology

## 2021-04-06 VITALS — BP 132/64 | HR 92 | Ht 64.0 in | Wt 171.8 lb

## 2021-04-06 DIAGNOSIS — I4811 Longstanding persistent atrial fibrillation: Secondary | ICD-10-CM | POA: Diagnosis not present

## 2021-04-06 DIAGNOSIS — Z7901 Long term (current) use of anticoagulants: Secondary | ICD-10-CM

## 2021-04-06 DIAGNOSIS — I130 Hypertensive heart and chronic kidney disease with heart failure and stage 1 through stage 4 chronic kidney disease, or unspecified chronic kidney disease: Secondary | ICD-10-CM

## 2021-04-06 NOTE — Patient Instructions (Signed)

## 2021-04-10 ENCOUNTER — Ambulatory Visit: Payer: Medicare HMO | Admitting: Cardiology

## 2021-04-14 DIAGNOSIS — E039 Hypothyroidism, unspecified: Secondary | ICD-10-CM | POA: Diagnosis not present

## 2021-04-21 ENCOUNTER — Encounter: Payer: Self-pay | Admitting: Internal Medicine

## 2021-05-04 DIAGNOSIS — I4891 Unspecified atrial fibrillation: Secondary | ICD-10-CM | POA: Diagnosis not present

## 2021-05-04 DIAGNOSIS — Z87891 Personal history of nicotine dependence: Secondary | ICD-10-CM | POA: Diagnosis not present

## 2021-05-04 DIAGNOSIS — Z90710 Acquired absence of both cervix and uterus: Secondary | ICD-10-CM | POA: Diagnosis not present

## 2021-05-04 DIAGNOSIS — J449 Chronic obstructive pulmonary disease, unspecified: Secondary | ICD-10-CM | POA: Diagnosis not present

## 2021-05-04 DIAGNOSIS — H2511 Age-related nuclear cataract, right eye: Secondary | ICD-10-CM | POA: Diagnosis not present

## 2021-05-04 DIAGNOSIS — I10 Essential (primary) hypertension: Secondary | ICD-10-CM | POA: Diagnosis not present

## 2021-05-04 DIAGNOSIS — I509 Heart failure, unspecified: Secondary | ICD-10-CM | POA: Diagnosis not present

## 2021-05-04 DIAGNOSIS — Z79899 Other long term (current) drug therapy: Secondary | ICD-10-CM | POA: Diagnosis not present

## 2021-05-04 DIAGNOSIS — H5703 Miosis: Secondary | ICD-10-CM | POA: Diagnosis not present

## 2021-05-04 DIAGNOSIS — I110 Hypertensive heart disease with heart failure: Secondary | ICD-10-CM | POA: Diagnosis not present

## 2021-05-21 DIAGNOSIS — Z23 Encounter for immunization: Secondary | ICD-10-CM | POA: Diagnosis not present

## 2021-06-19 DIAGNOSIS — I130 Hypertensive heart and chronic kidney disease with heart failure and stage 1 through stage 4 chronic kidney disease, or unspecified chronic kidney disease: Secondary | ICD-10-CM | POA: Diagnosis not present

## 2021-06-19 DIAGNOSIS — I5032 Chronic diastolic (congestive) heart failure: Secondary | ICD-10-CM | POA: Diagnosis not present

## 2021-06-19 DIAGNOSIS — G2581 Restless legs syndrome: Secondary | ICD-10-CM | POA: Diagnosis not present

## 2021-06-19 DIAGNOSIS — E785 Hyperlipidemia, unspecified: Secondary | ICD-10-CM | POA: Diagnosis not present

## 2021-06-19 DIAGNOSIS — D519 Vitamin B12 deficiency anemia, unspecified: Secondary | ICD-10-CM | POA: Diagnosis not present

## 2021-06-19 DIAGNOSIS — D638 Anemia in other chronic diseases classified elsewhere: Secondary | ICD-10-CM | POA: Diagnosis not present

## 2021-06-19 DIAGNOSIS — I4891 Unspecified atrial fibrillation: Secondary | ICD-10-CM | POA: Diagnosis not present

## 2021-06-19 DIAGNOSIS — E039 Hypothyroidism, unspecified: Secondary | ICD-10-CM | POA: Diagnosis not present

## 2021-06-19 DIAGNOSIS — N184 Chronic kidney disease, stage 4 (severe): Secondary | ICD-10-CM | POA: Diagnosis not present

## 2021-06-24 DIAGNOSIS — J449 Chronic obstructive pulmonary disease, unspecified: Secondary | ICD-10-CM | POA: Diagnosis not present

## 2021-06-24 DIAGNOSIS — E785 Hyperlipidemia, unspecified: Secondary | ICD-10-CM | POA: Diagnosis not present

## 2021-06-24 DIAGNOSIS — I130 Hypertensive heart and chronic kidney disease with heart failure and stage 1 through stage 4 chronic kidney disease, or unspecified chronic kidney disease: Secondary | ICD-10-CM | POA: Diagnosis not present

## 2021-06-30 DIAGNOSIS — D485 Neoplasm of uncertain behavior of skin: Secondary | ICD-10-CM | POA: Diagnosis not present

## 2021-06-30 DIAGNOSIS — D1801 Hemangioma of skin and subcutaneous tissue: Secondary | ICD-10-CM | POA: Diagnosis not present

## 2021-06-30 DIAGNOSIS — L814 Other melanin hyperpigmentation: Secondary | ICD-10-CM | POA: Diagnosis not present

## 2021-06-30 DIAGNOSIS — L821 Other seborrheic keratosis: Secondary | ICD-10-CM | POA: Diagnosis not present

## 2021-06-30 DIAGNOSIS — H61009 Unspecified perichondritis of external ear, unspecified ear: Secondary | ICD-10-CM | POA: Diagnosis not present

## 2021-06-30 DIAGNOSIS — L570 Actinic keratosis: Secondary | ICD-10-CM | POA: Diagnosis not present

## 2021-07-08 DIAGNOSIS — I130 Hypertensive heart and chronic kidney disease with heart failure and stage 1 through stage 4 chronic kidney disease, or unspecified chronic kidney disease: Secondary | ICD-10-CM | POA: Diagnosis not present

## 2021-07-08 DIAGNOSIS — Z87891 Personal history of nicotine dependence: Secondary | ICD-10-CM | POA: Diagnosis not present

## 2021-07-08 DIAGNOSIS — H2512 Age-related nuclear cataract, left eye: Secondary | ICD-10-CM | POA: Diagnosis not present

## 2021-07-08 DIAGNOSIS — I10 Essential (primary) hypertension: Secondary | ICD-10-CM | POA: Diagnosis not present

## 2021-07-08 DIAGNOSIS — D759 Disease of blood and blood-forming organs, unspecified: Secondary | ICD-10-CM | POA: Diagnosis not present

## 2021-07-08 DIAGNOSIS — I4891 Unspecified atrial fibrillation: Secondary | ICD-10-CM | POA: Diagnosis not present

## 2021-07-08 DIAGNOSIS — H5703 Miosis: Secondary | ICD-10-CM | POA: Diagnosis not present

## 2021-07-08 DIAGNOSIS — J449 Chronic obstructive pulmonary disease, unspecified: Secondary | ICD-10-CM | POA: Diagnosis not present

## 2021-07-08 DIAGNOSIS — Z7901 Long term (current) use of anticoagulants: Secondary | ICD-10-CM | POA: Diagnosis not present

## 2021-07-08 DIAGNOSIS — N189 Chronic kidney disease, unspecified: Secondary | ICD-10-CM | POA: Diagnosis not present

## 2021-07-24 DIAGNOSIS — I5032 Chronic diastolic (congestive) heart failure: Secondary | ICD-10-CM | POA: Diagnosis not present

## 2021-07-24 DIAGNOSIS — J449 Chronic obstructive pulmonary disease, unspecified: Secondary | ICD-10-CM | POA: Diagnosis not present

## 2021-07-24 DIAGNOSIS — I130 Hypertensive heart and chronic kidney disease with heart failure and stage 1 through stage 4 chronic kidney disease, or unspecified chronic kidney disease: Secondary | ICD-10-CM | POA: Diagnosis not present

## 2021-09-01 DIAGNOSIS — M545 Low back pain, unspecified: Secondary | ICD-10-CM | POA: Diagnosis not present

## 2021-09-01 DIAGNOSIS — M25551 Pain in right hip: Secondary | ICD-10-CM | POA: Diagnosis not present

## 2021-09-01 DIAGNOSIS — M2550 Pain in unspecified joint: Secondary | ICD-10-CM | POA: Diagnosis not present

## 2021-09-21 DIAGNOSIS — E785 Hyperlipidemia, unspecified: Secondary | ICD-10-CM | POA: Diagnosis not present

## 2021-09-21 DIAGNOSIS — N184 Chronic kidney disease, stage 4 (severe): Secondary | ICD-10-CM | POA: Diagnosis not present

## 2021-09-21 DIAGNOSIS — M5137 Other intervertebral disc degeneration, lumbosacral region: Secondary | ICD-10-CM | POA: Diagnosis not present

## 2021-09-21 DIAGNOSIS — I130 Hypertensive heart and chronic kidney disease with heart failure and stage 1 through stage 4 chronic kidney disease, or unspecified chronic kidney disease: Secondary | ICD-10-CM | POA: Diagnosis not present

## 2021-09-21 DIAGNOSIS — I5032 Chronic diastolic (congestive) heart failure: Secondary | ICD-10-CM | POA: Diagnosis not present

## 2021-09-21 DIAGNOSIS — G2581 Restless legs syndrome: Secondary | ICD-10-CM | POA: Diagnosis not present

## 2021-09-21 DIAGNOSIS — D519 Vitamin B12 deficiency anemia, unspecified: Secondary | ICD-10-CM | POA: Diagnosis not present

## 2021-09-21 DIAGNOSIS — J449 Chronic obstructive pulmonary disease, unspecified: Secondary | ICD-10-CM | POA: Diagnosis not present

## 2021-09-21 DIAGNOSIS — D638 Anemia in other chronic diseases classified elsewhere: Secondary | ICD-10-CM | POA: Diagnosis not present

## 2021-09-21 DIAGNOSIS — I4891 Unspecified atrial fibrillation: Secondary | ICD-10-CM | POA: Diagnosis not present

## 2021-09-21 DIAGNOSIS — E039 Hypothyroidism, unspecified: Secondary | ICD-10-CM | POA: Diagnosis not present

## 2021-10-22 ENCOUNTER — Other Ambulatory Visit: Payer: Self-pay | Admitting: Cardiology

## 2021-10-22 DIAGNOSIS — I4811 Longstanding persistent atrial fibrillation: Secondary | ICD-10-CM

## 2021-10-22 DIAGNOSIS — Z7901 Long term (current) use of anticoagulants: Secondary | ICD-10-CM

## 2021-10-22 NOTE — Telephone Encounter (Signed)
Prescription refill request for Eliquis received. ?Indication: a fib ?Last office visit: 04/06/10 ?Scr: 1.81 ?Age: 86 ?Weight: 77kg ?

## 2021-10-23 DIAGNOSIS — R21 Rash and other nonspecific skin eruption: Secondary | ICD-10-CM | POA: Diagnosis not present

## 2021-10-23 DIAGNOSIS — M5136 Other intervertebral disc degeneration, lumbar region: Secondary | ICD-10-CM | POA: Diagnosis not present

## 2021-11-18 ENCOUNTER — Other Ambulatory Visit: Payer: Self-pay | Admitting: Cardiology

## 2021-12-02 ENCOUNTER — Other Ambulatory Visit: Payer: Self-pay | Admitting: Cardiology

## 2021-12-23 DIAGNOSIS — N184 Chronic kidney disease, stage 4 (severe): Secondary | ICD-10-CM | POA: Diagnosis not present

## 2021-12-23 DIAGNOSIS — J449 Chronic obstructive pulmonary disease, unspecified: Secondary | ICD-10-CM | POA: Diagnosis not present

## 2021-12-31 DIAGNOSIS — E039 Hypothyroidism, unspecified: Secondary | ICD-10-CM | POA: Diagnosis not present

## 2021-12-31 DIAGNOSIS — I4891 Unspecified atrial fibrillation: Secondary | ICD-10-CM | POA: Diagnosis not present

## 2021-12-31 DIAGNOSIS — D638 Anemia in other chronic diseases classified elsewhere: Secondary | ICD-10-CM | POA: Diagnosis not present

## 2021-12-31 DIAGNOSIS — E785 Hyperlipidemia, unspecified: Secondary | ICD-10-CM | POA: Diagnosis not present

## 2021-12-31 DIAGNOSIS — I130 Hypertensive heart and chronic kidney disease with heart failure and stage 1 through stage 4 chronic kidney disease, or unspecified chronic kidney disease: Secondary | ICD-10-CM | POA: Diagnosis not present

## 2021-12-31 DIAGNOSIS — D519 Vitamin B12 deficiency anemia, unspecified: Secondary | ICD-10-CM | POA: Diagnosis not present

## 2021-12-31 DIAGNOSIS — M5136 Other intervertebral disc degeneration, lumbar region: Secondary | ICD-10-CM | POA: Diagnosis not present

## 2021-12-31 DIAGNOSIS — G2581 Restless legs syndrome: Secondary | ICD-10-CM | POA: Diagnosis not present

## 2021-12-31 DIAGNOSIS — J449 Chronic obstructive pulmonary disease, unspecified: Secondary | ICD-10-CM | POA: Diagnosis not present

## 2021-12-31 DIAGNOSIS — N184 Chronic kidney disease, stage 4 (severe): Secondary | ICD-10-CM | POA: Diagnosis not present

## 2021-12-31 DIAGNOSIS — I5032 Chronic diastolic (congestive) heart failure: Secondary | ICD-10-CM | POA: Diagnosis not present

## 2022-03-26 DIAGNOSIS — Z1231 Encounter for screening mammogram for malignant neoplasm of breast: Secondary | ICD-10-CM | POA: Diagnosis not present

## 2022-04-02 DIAGNOSIS — D519 Vitamin B12 deficiency anemia, unspecified: Secondary | ICD-10-CM | POA: Diagnosis not present

## 2022-04-02 DIAGNOSIS — N184 Chronic kidney disease, stage 4 (severe): Secondary | ICD-10-CM | POA: Diagnosis not present

## 2022-04-02 DIAGNOSIS — I5032 Chronic diastolic (congestive) heart failure: Secondary | ICD-10-CM | POA: Diagnosis not present

## 2022-04-02 DIAGNOSIS — I130 Hypertensive heart and chronic kidney disease with heart failure and stage 1 through stage 4 chronic kidney disease, or unspecified chronic kidney disease: Secondary | ICD-10-CM | POA: Diagnosis not present

## 2022-04-02 DIAGNOSIS — E039 Hypothyroidism, unspecified: Secondary | ICD-10-CM | POA: Diagnosis not present

## 2022-04-02 DIAGNOSIS — J449 Chronic obstructive pulmonary disease, unspecified: Secondary | ICD-10-CM | POA: Diagnosis not present

## 2022-04-02 DIAGNOSIS — D638 Anemia in other chronic diseases classified elsewhere: Secondary | ICD-10-CM | POA: Diagnosis not present

## 2022-04-02 DIAGNOSIS — I4891 Unspecified atrial fibrillation: Secondary | ICD-10-CM | POA: Diagnosis not present

## 2022-04-02 DIAGNOSIS — E785 Hyperlipidemia, unspecified: Secondary | ICD-10-CM | POA: Diagnosis not present

## 2022-04-02 DIAGNOSIS — G2581 Restless legs syndrome: Secondary | ICD-10-CM | POA: Diagnosis not present

## 2022-04-02 DIAGNOSIS — M5136 Other intervertebral disc degeneration, lumbar region: Secondary | ICD-10-CM | POA: Diagnosis not present

## 2022-04-06 DIAGNOSIS — Z170 Estrogen receptor positive status [ER+]: Secondary | ICD-10-CM | POA: Diagnosis not present

## 2022-04-06 DIAGNOSIS — C50412 Malignant neoplasm of upper-outer quadrant of left female breast: Secondary | ICD-10-CM | POA: Diagnosis not present

## 2022-04-21 ENCOUNTER — Other Ambulatory Visit: Payer: Self-pay | Admitting: Cardiology

## 2022-04-21 DIAGNOSIS — I4811 Longstanding persistent atrial fibrillation: Secondary | ICD-10-CM

## 2022-04-21 DIAGNOSIS — Z7901 Long term (current) use of anticoagulants: Secondary | ICD-10-CM

## 2022-04-22 NOTE — Telephone Encounter (Signed)
Prescription refill request for Eliquis received. Indication:Afib Last office visit:upcoming Scr:1.5 Age: 86 Weight:77.9 kg  Prescription refilled

## 2022-05-13 ENCOUNTER — Ambulatory Visit: Payer: Medicare HMO | Admitting: Cardiology

## 2022-05-13 DIAGNOSIS — L03031 Cellulitis of right toe: Secondary | ICD-10-CM | POA: Diagnosis not present

## 2022-05-13 DIAGNOSIS — J449 Chronic obstructive pulmonary disease, unspecified: Secondary | ICD-10-CM | POA: Diagnosis not present

## 2022-05-20 DIAGNOSIS — Z23 Encounter for immunization: Secondary | ICD-10-CM | POA: Diagnosis not present

## 2022-05-20 DIAGNOSIS — M109 Gout, unspecified: Secondary | ICD-10-CM | POA: Diagnosis not present

## 2022-06-04 ENCOUNTER — Other Ambulatory Visit: Payer: Self-pay | Admitting: Cardiology

## 2022-06-22 DIAGNOSIS — M109 Gout, unspecified: Secondary | ICD-10-CM | POA: Diagnosis not present

## 2022-06-29 DIAGNOSIS — M25532 Pain in left wrist: Secondary | ICD-10-CM | POA: Diagnosis not present

## 2022-06-29 DIAGNOSIS — M109 Gout, unspecified: Secondary | ICD-10-CM | POA: Diagnosis not present

## 2022-07-07 DIAGNOSIS — D519 Vitamin B12 deficiency anemia, unspecified: Secondary | ICD-10-CM | POA: Diagnosis not present

## 2022-07-07 DIAGNOSIS — D638 Anemia in other chronic diseases classified elsewhere: Secondary | ICD-10-CM | POA: Diagnosis not present

## 2022-07-07 DIAGNOSIS — E785 Hyperlipidemia, unspecified: Secondary | ICD-10-CM | POA: Diagnosis not present

## 2022-07-07 DIAGNOSIS — I4891 Unspecified atrial fibrillation: Secondary | ICD-10-CM | POA: Diagnosis not present

## 2022-07-07 DIAGNOSIS — I130 Hypertensive heart and chronic kidney disease with heart failure and stage 1 through stage 4 chronic kidney disease, or unspecified chronic kidney disease: Secondary | ICD-10-CM | POA: Diagnosis not present

## 2022-07-07 DIAGNOSIS — E039 Hypothyroidism, unspecified: Secondary | ICD-10-CM | POA: Diagnosis not present

## 2022-07-27 DIAGNOSIS — M1A9XX1 Chronic gout, unspecified, with tophus (tophi): Secondary | ICD-10-CM | POA: Diagnosis not present

## 2022-07-28 ENCOUNTER — Other Ambulatory Visit: Payer: Self-pay | Admitting: Cardiology

## 2022-07-29 NOTE — Telephone Encounter (Signed)
Rx refill sent to pharmacy. 

## 2022-08-07 DIAGNOSIS — J440 Chronic obstructive pulmonary disease with acute lower respiratory infection: Secondary | ICD-10-CM | POA: Diagnosis not present

## 2022-08-07 DIAGNOSIS — Z79899 Other long term (current) drug therapy: Secondary | ICD-10-CM | POA: Diagnosis not present

## 2022-08-07 DIAGNOSIS — I130 Hypertensive heart and chronic kidney disease with heart failure and stage 1 through stage 4 chronic kidney disease, or unspecified chronic kidney disease: Secondary | ICD-10-CM | POA: Diagnosis not present

## 2022-08-07 DIAGNOSIS — I4891 Unspecified atrial fibrillation: Secondary | ICD-10-CM | POA: Diagnosis not present

## 2022-08-07 DIAGNOSIS — I351 Nonrheumatic aortic (valve) insufficiency: Secondary | ICD-10-CM | POA: Diagnosis not present

## 2022-08-07 DIAGNOSIS — J9601 Acute respiratory failure with hypoxia: Secondary | ICD-10-CM | POA: Diagnosis not present

## 2022-08-07 DIAGNOSIS — Z87891 Personal history of nicotine dependence: Secondary | ICD-10-CM | POA: Diagnosis not present

## 2022-08-07 DIAGNOSIS — R0902 Hypoxemia: Secondary | ICD-10-CM | POA: Diagnosis not present

## 2022-08-07 DIAGNOSIS — I4519 Other right bundle-branch block: Secondary | ICD-10-CM | POA: Diagnosis not present

## 2022-08-07 DIAGNOSIS — Z853 Personal history of malignant neoplasm of breast: Secondary | ICD-10-CM | POA: Diagnosis not present

## 2022-08-07 DIAGNOSIS — J90 Pleural effusion, not elsewhere classified: Secondary | ICD-10-CM | POA: Diagnosis not present

## 2022-08-07 DIAGNOSIS — N189 Chronic kidney disease, unspecified: Secondary | ICD-10-CM | POA: Diagnosis not present

## 2022-08-07 DIAGNOSIS — I5033 Acute on chronic diastolic (congestive) heart failure: Secondary | ICD-10-CM | POA: Diagnosis not present

## 2022-08-07 DIAGNOSIS — Z923 Personal history of irradiation: Secondary | ICD-10-CM | POA: Diagnosis not present

## 2022-08-07 DIAGNOSIS — R0602 Shortness of breath: Secondary | ICD-10-CM | POA: Diagnosis not present

## 2022-08-07 DIAGNOSIS — Z880 Allergy status to penicillin: Secondary | ICD-10-CM | POA: Diagnosis not present

## 2022-08-07 DIAGNOSIS — R918 Other nonspecific abnormal finding of lung field: Secondary | ICD-10-CM | POA: Diagnosis not present

## 2022-08-07 DIAGNOSIS — Z7982 Long term (current) use of aspirin: Secondary | ICD-10-CM | POA: Diagnosis not present

## 2022-08-07 DIAGNOSIS — Z9010 Acquired absence of unspecified breast and nipple: Secondary | ICD-10-CM | POA: Diagnosis not present

## 2022-08-07 DIAGNOSIS — R9431 Abnormal electrocardiogram [ECG] [EKG]: Secondary | ICD-10-CM | POA: Diagnosis not present

## 2022-08-07 DIAGNOSIS — E7800 Pure hypercholesterolemia, unspecified: Secondary | ICD-10-CM | POA: Diagnosis not present

## 2022-08-07 DIAGNOSIS — I340 Nonrheumatic mitral (valve) insufficiency: Secondary | ICD-10-CM | POA: Diagnosis not present

## 2022-08-07 DIAGNOSIS — E876 Hypokalemia: Secondary | ICD-10-CM | POA: Diagnosis not present

## 2022-08-07 DIAGNOSIS — A419 Sepsis, unspecified organism: Secondary | ICD-10-CM | POA: Diagnosis not present

## 2022-08-07 DIAGNOSIS — J441 Chronic obstructive pulmonary disease with (acute) exacerbation: Secondary | ICD-10-CM | POA: Diagnosis not present

## 2022-08-07 DIAGNOSIS — E785 Hyperlipidemia, unspecified: Secondary | ICD-10-CM | POA: Diagnosis not present

## 2022-08-07 DIAGNOSIS — R112 Nausea with vomiting, unspecified: Secondary | ICD-10-CM | POA: Diagnosis not present

## 2022-08-08 DIAGNOSIS — I340 Nonrheumatic mitral (valve) insufficiency: Secondary | ICD-10-CM

## 2022-08-08 DIAGNOSIS — I351 Nonrheumatic aortic (valve) insufficiency: Secondary | ICD-10-CM

## 2022-08-18 DIAGNOSIS — G9389 Other specified disorders of brain: Secondary | ICD-10-CM | POA: Diagnosis not present

## 2022-08-18 DIAGNOSIS — G928 Other toxic encephalopathy: Secondary | ICD-10-CM | POA: Diagnosis not present

## 2022-08-18 DIAGNOSIS — E7800 Pure hypercholesterolemia, unspecified: Secondary | ICD-10-CM | POA: Diagnosis not present

## 2022-08-18 DIAGNOSIS — Z880 Allergy status to penicillin: Secondary | ICD-10-CM | POA: Diagnosis not present

## 2022-08-18 DIAGNOSIS — J449 Chronic obstructive pulmonary disease, unspecified: Secondary | ICD-10-CM | POA: Diagnosis not present

## 2022-08-18 DIAGNOSIS — I130 Hypertensive heart and chronic kidney disease with heart failure and stage 1 through stage 4 chronic kidney disease, or unspecified chronic kidney disease: Secondary | ICD-10-CM | POA: Diagnosis not present

## 2022-08-18 DIAGNOSIS — R0902 Hypoxemia: Secondary | ICD-10-CM | POA: Diagnosis not present

## 2022-08-18 DIAGNOSIS — I482 Chronic atrial fibrillation, unspecified: Secondary | ICD-10-CM | POA: Diagnosis not present

## 2022-08-18 DIAGNOSIS — R918 Other nonspecific abnormal finding of lung field: Secondary | ICD-10-CM | POA: Diagnosis not present

## 2022-08-18 DIAGNOSIS — I700 Atherosclerosis of aorta: Secondary | ICD-10-CM | POA: Diagnosis not present

## 2022-08-18 DIAGNOSIS — R531 Weakness: Secondary | ICD-10-CM | POA: Diagnosis not present

## 2022-08-18 DIAGNOSIS — Z79899 Other long term (current) drug therapy: Secondary | ICD-10-CM | POA: Diagnosis not present

## 2022-08-18 DIAGNOSIS — N189 Chronic kidney disease, unspecified: Secondary | ICD-10-CM | POA: Diagnosis not present

## 2022-08-18 DIAGNOSIS — M19071 Primary osteoarthritis, right ankle and foot: Secondary | ICD-10-CM | POA: Diagnosis not present

## 2022-08-18 DIAGNOSIS — I10 Essential (primary) hypertension: Secondary | ICD-10-CM | POA: Diagnosis not present

## 2022-08-18 DIAGNOSIS — I498 Other specified cardiac arrhythmias: Secondary | ICD-10-CM | POA: Diagnosis not present

## 2022-08-18 DIAGNOSIS — Z853 Personal history of malignant neoplasm of breast: Secondary | ICD-10-CM | POA: Diagnosis not present

## 2022-08-18 DIAGNOSIS — Z888 Allergy status to other drugs, medicaments and biological substances status: Secondary | ICD-10-CM | POA: Diagnosis not present

## 2022-08-18 DIAGNOSIS — J9621 Acute and chronic respiratory failure with hypoxia: Secondary | ICD-10-CM | POA: Diagnosis not present

## 2022-08-18 DIAGNOSIS — M1990 Unspecified osteoarthritis, unspecified site: Secondary | ICD-10-CM | POA: Diagnosis not present

## 2022-08-18 DIAGNOSIS — J440 Chronic obstructive pulmonary disease with acute lower respiratory infection: Secondary | ICD-10-CM | POA: Diagnosis not present

## 2022-08-18 DIAGNOSIS — Z043 Encounter for examination and observation following other accident: Secondary | ICD-10-CM | POA: Diagnosis not present

## 2022-08-18 DIAGNOSIS — R410 Disorientation, unspecified: Secondary | ICD-10-CM | POA: Diagnosis not present

## 2022-08-18 DIAGNOSIS — E039 Hypothyroidism, unspecified: Secondary | ICD-10-CM | POA: Diagnosis not present

## 2022-08-18 DIAGNOSIS — Z7901 Long term (current) use of anticoagulants: Secondary | ICD-10-CM | POA: Diagnosis not present

## 2022-08-18 DIAGNOSIS — M778 Other enthesopathies, not elsewhere classified: Secondary | ICD-10-CM | POA: Diagnosis not present

## 2022-08-18 DIAGNOSIS — I509 Heart failure, unspecified: Secondary | ICD-10-CM | POA: Diagnosis not present

## 2022-08-18 DIAGNOSIS — R9431 Abnormal electrocardiogram [ECG] [EKG]: Secondary | ICD-10-CM | POA: Diagnosis not present

## 2022-08-19 DIAGNOSIS — R0902 Hypoxemia: Secondary | ICD-10-CM | POA: Diagnosis not present

## 2022-08-26 DIAGNOSIS — N184 Chronic kidney disease, stage 4 (severe): Secondary | ICD-10-CM | POA: Diagnosis not present

## 2022-08-26 DIAGNOSIS — I2510 Atherosclerotic heart disease of native coronary artery without angina pectoris: Secondary | ICD-10-CM | POA: Diagnosis not present

## 2022-08-26 DIAGNOSIS — I700 Atherosclerosis of aorta: Secondary | ICD-10-CM | POA: Diagnosis not present

## 2022-08-26 DIAGNOSIS — D631 Anemia in chronic kidney disease: Secondary | ICD-10-CM | POA: Diagnosis not present

## 2022-08-26 DIAGNOSIS — K219 Gastro-esophageal reflux disease without esophagitis: Secondary | ICD-10-CM | POA: Diagnosis not present

## 2022-08-26 DIAGNOSIS — I130 Hypertensive heart and chronic kidney disease with heart failure and stage 1 through stage 4 chronic kidney disease, or unspecified chronic kidney disease: Secondary | ICD-10-CM | POA: Diagnosis not present

## 2022-08-26 DIAGNOSIS — I5032 Chronic diastolic (congestive) heart failure: Secondary | ICD-10-CM | POA: Diagnosis not present

## 2022-08-26 DIAGNOSIS — M5137 Other intervertebral disc degeneration, lumbosacral region: Secondary | ICD-10-CM | POA: Diagnosis not present

## 2022-08-26 DIAGNOSIS — M1990 Unspecified osteoarthritis, unspecified site: Secondary | ICD-10-CM | POA: Diagnosis not present

## 2022-08-26 DIAGNOSIS — E876 Hypokalemia: Secondary | ICD-10-CM | POA: Diagnosis not present

## 2022-08-26 DIAGNOSIS — A419 Sepsis, unspecified organism: Secondary | ICD-10-CM | POA: Diagnosis not present

## 2022-08-26 DIAGNOSIS — J9621 Acute and chronic respiratory failure with hypoxia: Secondary | ICD-10-CM | POA: Diagnosis not present

## 2022-08-26 DIAGNOSIS — G2581 Restless legs syndrome: Secondary | ICD-10-CM | POA: Diagnosis not present

## 2022-08-26 DIAGNOSIS — S51001D Unspecified open wound of right elbow, subsequent encounter: Secondary | ICD-10-CM | POA: Diagnosis not present

## 2022-08-26 DIAGNOSIS — E7800 Pure hypercholesterolemia, unspecified: Secondary | ICD-10-CM | POA: Diagnosis not present

## 2022-08-26 DIAGNOSIS — J441 Chronic obstructive pulmonary disease with (acute) exacerbation: Secondary | ICD-10-CM | POA: Diagnosis not present

## 2022-08-26 DIAGNOSIS — I482 Chronic atrial fibrillation, unspecified: Secondary | ICD-10-CM | POA: Diagnosis not present

## 2022-08-26 DIAGNOSIS — E039 Hypothyroidism, unspecified: Secondary | ICD-10-CM | POA: Diagnosis not present

## 2022-08-26 DIAGNOSIS — J440 Chronic obstructive pulmonary disease with acute lower respiratory infection: Secondary | ICD-10-CM | POA: Diagnosis not present

## 2022-08-26 DIAGNOSIS — D510 Vitamin B12 deficiency anemia due to intrinsic factor deficiency: Secondary | ICD-10-CM | POA: Diagnosis not present

## 2022-08-27 DIAGNOSIS — M1990 Unspecified osteoarthritis, unspecified site: Secondary | ICD-10-CM | POA: Diagnosis not present

## 2022-08-27 DIAGNOSIS — I482 Chronic atrial fibrillation, unspecified: Secondary | ICD-10-CM | POA: Diagnosis not present

## 2022-08-27 DIAGNOSIS — K219 Gastro-esophageal reflux disease without esophagitis: Secondary | ICD-10-CM | POA: Diagnosis not present

## 2022-08-27 DIAGNOSIS — I5032 Chronic diastolic (congestive) heart failure: Secondary | ICD-10-CM | POA: Diagnosis not present

## 2022-08-27 DIAGNOSIS — I2510 Atherosclerotic heart disease of native coronary artery without angina pectoris: Secondary | ICD-10-CM | POA: Diagnosis not present

## 2022-08-27 DIAGNOSIS — G2581 Restless legs syndrome: Secondary | ICD-10-CM | POA: Diagnosis not present

## 2022-08-27 DIAGNOSIS — I130 Hypertensive heart and chronic kidney disease with heart failure and stage 1 through stage 4 chronic kidney disease, or unspecified chronic kidney disease: Secondary | ICD-10-CM | POA: Diagnosis not present

## 2022-08-27 DIAGNOSIS — J440 Chronic obstructive pulmonary disease with acute lower respiratory infection: Secondary | ICD-10-CM | POA: Diagnosis not present

## 2022-08-27 DIAGNOSIS — N184 Chronic kidney disease, stage 4 (severe): Secondary | ICD-10-CM | POA: Diagnosis not present

## 2022-08-27 DIAGNOSIS — E7800 Pure hypercholesterolemia, unspecified: Secondary | ICD-10-CM | POA: Diagnosis not present

## 2022-08-27 DIAGNOSIS — I700 Atherosclerosis of aorta: Secondary | ICD-10-CM | POA: Diagnosis not present

## 2022-08-27 DIAGNOSIS — E039 Hypothyroidism, unspecified: Secondary | ICD-10-CM | POA: Diagnosis not present

## 2022-08-27 DIAGNOSIS — J9621 Acute and chronic respiratory failure with hypoxia: Secondary | ICD-10-CM | POA: Diagnosis not present

## 2022-08-27 DIAGNOSIS — D510 Vitamin B12 deficiency anemia due to intrinsic factor deficiency: Secondary | ICD-10-CM | POA: Diagnosis not present

## 2022-08-27 DIAGNOSIS — D631 Anemia in chronic kidney disease: Secondary | ICD-10-CM | POA: Diagnosis not present

## 2022-08-27 DIAGNOSIS — E876 Hypokalemia: Secondary | ICD-10-CM | POA: Diagnosis not present

## 2022-08-27 DIAGNOSIS — S51001D Unspecified open wound of right elbow, subsequent encounter: Secondary | ICD-10-CM | POA: Diagnosis not present

## 2022-08-27 DIAGNOSIS — A419 Sepsis, unspecified organism: Secondary | ICD-10-CM | POA: Diagnosis not present

## 2022-08-27 DIAGNOSIS — J441 Chronic obstructive pulmonary disease with (acute) exacerbation: Secondary | ICD-10-CM | POA: Diagnosis not present

## 2022-08-27 DIAGNOSIS — M5137 Other intervertebral disc degeneration, lumbosacral region: Secondary | ICD-10-CM | POA: Diagnosis not present

## 2022-08-30 DIAGNOSIS — D510 Vitamin B12 deficiency anemia due to intrinsic factor deficiency: Secondary | ICD-10-CM | POA: Diagnosis not present

## 2022-08-30 DIAGNOSIS — I130 Hypertensive heart and chronic kidney disease with heart failure and stage 1 through stage 4 chronic kidney disease, or unspecified chronic kidney disease: Secondary | ICD-10-CM | POA: Diagnosis not present

## 2022-08-30 DIAGNOSIS — S51001D Unspecified open wound of right elbow, subsequent encounter: Secondary | ICD-10-CM | POA: Diagnosis not present

## 2022-08-30 DIAGNOSIS — D631 Anemia in chronic kidney disease: Secondary | ICD-10-CM | POA: Diagnosis not present

## 2022-08-30 DIAGNOSIS — E7800 Pure hypercholesterolemia, unspecified: Secondary | ICD-10-CM | POA: Diagnosis not present

## 2022-08-30 DIAGNOSIS — K219 Gastro-esophageal reflux disease without esophagitis: Secondary | ICD-10-CM | POA: Diagnosis not present

## 2022-08-30 DIAGNOSIS — I5032 Chronic diastolic (congestive) heart failure: Secondary | ICD-10-CM | POA: Diagnosis not present

## 2022-08-30 DIAGNOSIS — M1990 Unspecified osteoarthritis, unspecified site: Secondary | ICD-10-CM | POA: Diagnosis not present

## 2022-08-30 DIAGNOSIS — N184 Chronic kidney disease, stage 4 (severe): Secondary | ICD-10-CM | POA: Diagnosis not present

## 2022-08-30 DIAGNOSIS — J9621 Acute and chronic respiratory failure with hypoxia: Secondary | ICD-10-CM | POA: Diagnosis not present

## 2022-08-30 DIAGNOSIS — J441 Chronic obstructive pulmonary disease with (acute) exacerbation: Secondary | ICD-10-CM | POA: Diagnosis not present

## 2022-08-30 DIAGNOSIS — I700 Atherosclerosis of aorta: Secondary | ICD-10-CM | POA: Diagnosis not present

## 2022-08-30 DIAGNOSIS — M5137 Other intervertebral disc degeneration, lumbosacral region: Secondary | ICD-10-CM | POA: Diagnosis not present

## 2022-08-30 DIAGNOSIS — I2510 Atherosclerotic heart disease of native coronary artery without angina pectoris: Secondary | ICD-10-CM | POA: Diagnosis not present

## 2022-08-30 DIAGNOSIS — E039 Hypothyroidism, unspecified: Secondary | ICD-10-CM | POA: Diagnosis not present

## 2022-08-30 DIAGNOSIS — J440 Chronic obstructive pulmonary disease with acute lower respiratory infection: Secondary | ICD-10-CM | POA: Diagnosis not present

## 2022-08-30 DIAGNOSIS — I482 Chronic atrial fibrillation, unspecified: Secondary | ICD-10-CM | POA: Diagnosis not present

## 2022-08-30 DIAGNOSIS — E876 Hypokalemia: Secondary | ICD-10-CM | POA: Diagnosis not present

## 2022-08-30 DIAGNOSIS — G2581 Restless legs syndrome: Secondary | ICD-10-CM | POA: Diagnosis not present

## 2022-08-30 DIAGNOSIS — A419 Sepsis, unspecified organism: Secondary | ICD-10-CM | POA: Diagnosis not present

## 2022-08-31 DIAGNOSIS — J9621 Acute and chronic respiratory failure with hypoxia: Secondary | ICD-10-CM | POA: Diagnosis not present

## 2022-08-31 DIAGNOSIS — G2581 Restless legs syndrome: Secondary | ICD-10-CM | POA: Diagnosis not present

## 2022-08-31 DIAGNOSIS — K219 Gastro-esophageal reflux disease without esophagitis: Secondary | ICD-10-CM | POA: Diagnosis not present

## 2022-08-31 DIAGNOSIS — J440 Chronic obstructive pulmonary disease with acute lower respiratory infection: Secondary | ICD-10-CM | POA: Diagnosis not present

## 2022-08-31 DIAGNOSIS — D631 Anemia in chronic kidney disease: Secondary | ICD-10-CM | POA: Diagnosis not present

## 2022-08-31 DIAGNOSIS — I700 Atherosclerosis of aorta: Secondary | ICD-10-CM | POA: Diagnosis not present

## 2022-08-31 DIAGNOSIS — D510 Vitamin B12 deficiency anemia due to intrinsic factor deficiency: Secondary | ICD-10-CM | POA: Diagnosis not present

## 2022-08-31 DIAGNOSIS — I482 Chronic atrial fibrillation, unspecified: Secondary | ICD-10-CM | POA: Diagnosis not present

## 2022-08-31 DIAGNOSIS — A419 Sepsis, unspecified organism: Secondary | ICD-10-CM | POA: Diagnosis not present

## 2022-08-31 DIAGNOSIS — M1990 Unspecified osteoarthritis, unspecified site: Secondary | ICD-10-CM | POA: Diagnosis not present

## 2022-08-31 DIAGNOSIS — I2510 Atherosclerotic heart disease of native coronary artery without angina pectoris: Secondary | ICD-10-CM | POA: Diagnosis not present

## 2022-08-31 DIAGNOSIS — S51001D Unspecified open wound of right elbow, subsequent encounter: Secondary | ICD-10-CM | POA: Diagnosis not present

## 2022-08-31 DIAGNOSIS — E876 Hypokalemia: Secondary | ICD-10-CM | POA: Diagnosis not present

## 2022-08-31 DIAGNOSIS — E7800 Pure hypercholesterolemia, unspecified: Secondary | ICD-10-CM | POA: Diagnosis not present

## 2022-08-31 DIAGNOSIS — I5032 Chronic diastolic (congestive) heart failure: Secondary | ICD-10-CM | POA: Diagnosis not present

## 2022-08-31 DIAGNOSIS — M5137 Other intervertebral disc degeneration, lumbosacral region: Secondary | ICD-10-CM | POA: Diagnosis not present

## 2022-08-31 DIAGNOSIS — I130 Hypertensive heart and chronic kidney disease with heart failure and stage 1 through stage 4 chronic kidney disease, or unspecified chronic kidney disease: Secondary | ICD-10-CM | POA: Diagnosis not present

## 2022-08-31 DIAGNOSIS — J441 Chronic obstructive pulmonary disease with (acute) exacerbation: Secondary | ICD-10-CM | POA: Diagnosis not present

## 2022-08-31 DIAGNOSIS — E039 Hypothyroidism, unspecified: Secondary | ICD-10-CM | POA: Diagnosis not present

## 2022-08-31 DIAGNOSIS — N184 Chronic kidney disease, stage 4 (severe): Secondary | ICD-10-CM | POA: Diagnosis not present

## 2022-09-02 DIAGNOSIS — E7800 Pure hypercholesterolemia, unspecified: Secondary | ICD-10-CM | POA: Diagnosis not present

## 2022-09-02 DIAGNOSIS — M5137 Other intervertebral disc degeneration, lumbosacral region: Secondary | ICD-10-CM | POA: Diagnosis not present

## 2022-09-02 DIAGNOSIS — I5032 Chronic diastolic (congestive) heart failure: Secondary | ICD-10-CM | POA: Diagnosis not present

## 2022-09-02 DIAGNOSIS — D510 Vitamin B12 deficiency anemia due to intrinsic factor deficiency: Secondary | ICD-10-CM | POA: Diagnosis not present

## 2022-09-02 DIAGNOSIS — N184 Chronic kidney disease, stage 4 (severe): Secondary | ICD-10-CM | POA: Diagnosis not present

## 2022-09-02 DIAGNOSIS — M1990 Unspecified osteoarthritis, unspecified site: Secondary | ICD-10-CM | POA: Diagnosis not present

## 2022-09-02 DIAGNOSIS — I700 Atherosclerosis of aorta: Secondary | ICD-10-CM | POA: Diagnosis not present

## 2022-09-02 DIAGNOSIS — I2510 Atherosclerotic heart disease of native coronary artery without angina pectoris: Secondary | ICD-10-CM | POA: Diagnosis not present

## 2022-09-02 DIAGNOSIS — I482 Chronic atrial fibrillation, unspecified: Secondary | ICD-10-CM | POA: Diagnosis not present

## 2022-09-02 DIAGNOSIS — E039 Hypothyroidism, unspecified: Secondary | ICD-10-CM | POA: Diagnosis not present

## 2022-09-02 DIAGNOSIS — J440 Chronic obstructive pulmonary disease with acute lower respiratory infection: Secondary | ICD-10-CM | POA: Diagnosis not present

## 2022-09-02 DIAGNOSIS — I130 Hypertensive heart and chronic kidney disease with heart failure and stage 1 through stage 4 chronic kidney disease, or unspecified chronic kidney disease: Secondary | ICD-10-CM | POA: Diagnosis not present

## 2022-09-02 DIAGNOSIS — K219 Gastro-esophageal reflux disease without esophagitis: Secondary | ICD-10-CM | POA: Diagnosis not present

## 2022-09-02 DIAGNOSIS — S51001D Unspecified open wound of right elbow, subsequent encounter: Secondary | ICD-10-CM | POA: Diagnosis not present

## 2022-09-02 DIAGNOSIS — J9621 Acute and chronic respiratory failure with hypoxia: Secondary | ICD-10-CM | POA: Diagnosis not present

## 2022-09-02 DIAGNOSIS — D631 Anemia in chronic kidney disease: Secondary | ICD-10-CM | POA: Diagnosis not present

## 2022-09-02 DIAGNOSIS — E876 Hypokalemia: Secondary | ICD-10-CM | POA: Diagnosis not present

## 2022-09-02 DIAGNOSIS — A419 Sepsis, unspecified organism: Secondary | ICD-10-CM | POA: Diagnosis not present

## 2022-09-02 DIAGNOSIS — G2581 Restless legs syndrome: Secondary | ICD-10-CM | POA: Diagnosis not present

## 2022-09-02 DIAGNOSIS — J441 Chronic obstructive pulmonary disease with (acute) exacerbation: Secondary | ICD-10-CM | POA: Diagnosis not present

## 2022-09-03 DIAGNOSIS — A419 Sepsis, unspecified organism: Secondary | ICD-10-CM | POA: Diagnosis not present

## 2022-09-03 DIAGNOSIS — J9621 Acute and chronic respiratory failure with hypoxia: Secondary | ICD-10-CM | POA: Diagnosis not present

## 2022-09-06 DIAGNOSIS — D510 Vitamin B12 deficiency anemia due to intrinsic factor deficiency: Secondary | ICD-10-CM | POA: Diagnosis not present

## 2022-09-06 DIAGNOSIS — I130 Hypertensive heart and chronic kidney disease with heart failure and stage 1 through stage 4 chronic kidney disease, or unspecified chronic kidney disease: Secondary | ICD-10-CM | POA: Diagnosis not present

## 2022-09-06 DIAGNOSIS — J441 Chronic obstructive pulmonary disease with (acute) exacerbation: Secondary | ICD-10-CM | POA: Diagnosis not present

## 2022-09-06 DIAGNOSIS — A419 Sepsis, unspecified organism: Secondary | ICD-10-CM | POA: Diagnosis not present

## 2022-09-06 DIAGNOSIS — E876 Hypokalemia: Secondary | ICD-10-CM | POA: Diagnosis not present

## 2022-09-06 DIAGNOSIS — I5032 Chronic diastolic (congestive) heart failure: Secondary | ICD-10-CM | POA: Diagnosis not present

## 2022-09-06 DIAGNOSIS — M1990 Unspecified osteoarthritis, unspecified site: Secondary | ICD-10-CM | POA: Diagnosis not present

## 2022-09-06 DIAGNOSIS — I482 Chronic atrial fibrillation, unspecified: Secondary | ICD-10-CM | POA: Diagnosis not present

## 2022-09-06 DIAGNOSIS — K219 Gastro-esophageal reflux disease without esophagitis: Secondary | ICD-10-CM | POA: Diagnosis not present

## 2022-09-06 DIAGNOSIS — E039 Hypothyroidism, unspecified: Secondary | ICD-10-CM | POA: Diagnosis not present

## 2022-09-06 DIAGNOSIS — J440 Chronic obstructive pulmonary disease with acute lower respiratory infection: Secondary | ICD-10-CM | POA: Diagnosis not present

## 2022-09-06 DIAGNOSIS — G2581 Restless legs syndrome: Secondary | ICD-10-CM | POA: Diagnosis not present

## 2022-09-06 DIAGNOSIS — I2510 Atherosclerotic heart disease of native coronary artery without angina pectoris: Secondary | ICD-10-CM | POA: Diagnosis not present

## 2022-09-06 DIAGNOSIS — I700 Atherosclerosis of aorta: Secondary | ICD-10-CM | POA: Diagnosis not present

## 2022-09-06 DIAGNOSIS — D631 Anemia in chronic kidney disease: Secondary | ICD-10-CM | POA: Diagnosis not present

## 2022-09-06 DIAGNOSIS — S51001D Unspecified open wound of right elbow, subsequent encounter: Secondary | ICD-10-CM | POA: Diagnosis not present

## 2022-09-06 DIAGNOSIS — N184 Chronic kidney disease, stage 4 (severe): Secondary | ICD-10-CM | POA: Diagnosis not present

## 2022-09-06 DIAGNOSIS — E7800 Pure hypercholesterolemia, unspecified: Secondary | ICD-10-CM | POA: Diagnosis not present

## 2022-09-06 DIAGNOSIS — M5137 Other intervertebral disc degeneration, lumbosacral region: Secondary | ICD-10-CM | POA: Diagnosis not present

## 2022-09-06 DIAGNOSIS — J9621 Acute and chronic respiratory failure with hypoxia: Secondary | ICD-10-CM | POA: Diagnosis not present

## 2022-09-07 DIAGNOSIS — I482 Chronic atrial fibrillation, unspecified: Secondary | ICD-10-CM | POA: Diagnosis not present

## 2022-09-07 DIAGNOSIS — I2510 Atherosclerotic heart disease of native coronary artery without angina pectoris: Secondary | ICD-10-CM | POA: Diagnosis not present

## 2022-09-07 DIAGNOSIS — J440 Chronic obstructive pulmonary disease with acute lower respiratory infection: Secondary | ICD-10-CM | POA: Diagnosis not present

## 2022-09-07 DIAGNOSIS — N184 Chronic kidney disease, stage 4 (severe): Secondary | ICD-10-CM | POA: Diagnosis not present

## 2022-09-07 DIAGNOSIS — E039 Hypothyroidism, unspecified: Secondary | ICD-10-CM | POA: Diagnosis not present

## 2022-09-07 DIAGNOSIS — I130 Hypertensive heart and chronic kidney disease with heart failure and stage 1 through stage 4 chronic kidney disease, or unspecified chronic kidney disease: Secondary | ICD-10-CM | POA: Diagnosis not present

## 2022-09-07 DIAGNOSIS — G2581 Restless legs syndrome: Secondary | ICD-10-CM | POA: Diagnosis not present

## 2022-09-07 DIAGNOSIS — E876 Hypokalemia: Secondary | ICD-10-CM | POA: Diagnosis not present

## 2022-09-07 DIAGNOSIS — E7800 Pure hypercholesterolemia, unspecified: Secondary | ICD-10-CM | POA: Diagnosis not present

## 2022-09-07 DIAGNOSIS — S51001D Unspecified open wound of right elbow, subsequent encounter: Secondary | ICD-10-CM | POA: Diagnosis not present

## 2022-09-07 DIAGNOSIS — K219 Gastro-esophageal reflux disease without esophagitis: Secondary | ICD-10-CM | POA: Diagnosis not present

## 2022-09-07 DIAGNOSIS — M5137 Other intervertebral disc degeneration, lumbosacral region: Secondary | ICD-10-CM | POA: Diagnosis not present

## 2022-09-07 DIAGNOSIS — J441 Chronic obstructive pulmonary disease with (acute) exacerbation: Secondary | ICD-10-CM | POA: Diagnosis not present

## 2022-09-07 DIAGNOSIS — I700 Atherosclerosis of aorta: Secondary | ICD-10-CM | POA: Diagnosis not present

## 2022-09-07 DIAGNOSIS — M1990 Unspecified osteoarthritis, unspecified site: Secondary | ICD-10-CM | POA: Diagnosis not present

## 2022-09-07 DIAGNOSIS — D631 Anemia in chronic kidney disease: Secondary | ICD-10-CM | POA: Diagnosis not present

## 2022-09-07 DIAGNOSIS — A419 Sepsis, unspecified organism: Secondary | ICD-10-CM | POA: Diagnosis not present

## 2022-09-07 DIAGNOSIS — I5032 Chronic diastolic (congestive) heart failure: Secondary | ICD-10-CM | POA: Diagnosis not present

## 2022-09-07 DIAGNOSIS — J9621 Acute and chronic respiratory failure with hypoxia: Secondary | ICD-10-CM | POA: Diagnosis not present

## 2022-09-07 DIAGNOSIS — D510 Vitamin B12 deficiency anemia due to intrinsic factor deficiency: Secondary | ICD-10-CM | POA: Diagnosis not present

## 2022-09-09 DIAGNOSIS — J441 Chronic obstructive pulmonary disease with (acute) exacerbation: Secondary | ICD-10-CM | POA: Diagnosis not present

## 2022-09-09 DIAGNOSIS — G2581 Restless legs syndrome: Secondary | ICD-10-CM | POA: Diagnosis not present

## 2022-09-09 DIAGNOSIS — K219 Gastro-esophageal reflux disease without esophagitis: Secondary | ICD-10-CM | POA: Diagnosis not present

## 2022-09-09 DIAGNOSIS — S51001D Unspecified open wound of right elbow, subsequent encounter: Secondary | ICD-10-CM | POA: Diagnosis not present

## 2022-09-09 DIAGNOSIS — E039 Hypothyroidism, unspecified: Secondary | ICD-10-CM | POA: Diagnosis not present

## 2022-09-09 DIAGNOSIS — I700 Atherosclerosis of aorta: Secondary | ICD-10-CM | POA: Diagnosis not present

## 2022-09-09 DIAGNOSIS — M1990 Unspecified osteoarthritis, unspecified site: Secondary | ICD-10-CM | POA: Diagnosis not present

## 2022-09-09 DIAGNOSIS — J440 Chronic obstructive pulmonary disease with acute lower respiratory infection: Secondary | ICD-10-CM | POA: Diagnosis not present

## 2022-09-09 DIAGNOSIS — I5032 Chronic diastolic (congestive) heart failure: Secondary | ICD-10-CM | POA: Diagnosis not present

## 2022-09-09 DIAGNOSIS — A419 Sepsis, unspecified organism: Secondary | ICD-10-CM | POA: Diagnosis not present

## 2022-09-09 DIAGNOSIS — E876 Hypokalemia: Secondary | ICD-10-CM | POA: Diagnosis not present

## 2022-09-09 DIAGNOSIS — I482 Chronic atrial fibrillation, unspecified: Secondary | ICD-10-CM | POA: Diagnosis not present

## 2022-09-09 DIAGNOSIS — I2510 Atherosclerotic heart disease of native coronary artery without angina pectoris: Secondary | ICD-10-CM | POA: Diagnosis not present

## 2022-09-09 DIAGNOSIS — D631 Anemia in chronic kidney disease: Secondary | ICD-10-CM | POA: Diagnosis not present

## 2022-09-09 DIAGNOSIS — M5137 Other intervertebral disc degeneration, lumbosacral region: Secondary | ICD-10-CM | POA: Diagnosis not present

## 2022-09-09 DIAGNOSIS — J9621 Acute and chronic respiratory failure with hypoxia: Secondary | ICD-10-CM | POA: Diagnosis not present

## 2022-09-09 DIAGNOSIS — E7800 Pure hypercholesterolemia, unspecified: Secondary | ICD-10-CM | POA: Diagnosis not present

## 2022-09-09 DIAGNOSIS — D510 Vitamin B12 deficiency anemia due to intrinsic factor deficiency: Secondary | ICD-10-CM | POA: Diagnosis not present

## 2022-09-09 DIAGNOSIS — I130 Hypertensive heart and chronic kidney disease with heart failure and stage 1 through stage 4 chronic kidney disease, or unspecified chronic kidney disease: Secondary | ICD-10-CM | POA: Diagnosis not present

## 2022-09-09 DIAGNOSIS — N184 Chronic kidney disease, stage 4 (severe): Secondary | ICD-10-CM | POA: Diagnosis not present

## 2022-09-13 DIAGNOSIS — E876 Hypokalemia: Secondary | ICD-10-CM | POA: Diagnosis not present

## 2022-09-13 DIAGNOSIS — G2581 Restless legs syndrome: Secondary | ICD-10-CM | POA: Diagnosis not present

## 2022-09-13 DIAGNOSIS — J9621 Acute and chronic respiratory failure with hypoxia: Secondary | ICD-10-CM | POA: Diagnosis not present

## 2022-09-13 DIAGNOSIS — E039 Hypothyroidism, unspecified: Secondary | ICD-10-CM | POA: Diagnosis not present

## 2022-09-13 DIAGNOSIS — J441 Chronic obstructive pulmonary disease with (acute) exacerbation: Secondary | ICD-10-CM | POA: Diagnosis not present

## 2022-09-13 DIAGNOSIS — K219 Gastro-esophageal reflux disease without esophagitis: Secondary | ICD-10-CM | POA: Diagnosis not present

## 2022-09-13 DIAGNOSIS — I482 Chronic atrial fibrillation, unspecified: Secondary | ICD-10-CM | POA: Diagnosis not present

## 2022-09-13 DIAGNOSIS — D631 Anemia in chronic kidney disease: Secondary | ICD-10-CM | POA: Diagnosis not present

## 2022-09-13 DIAGNOSIS — M5137 Other intervertebral disc degeneration, lumbosacral region: Secondary | ICD-10-CM | POA: Diagnosis not present

## 2022-09-13 DIAGNOSIS — D510 Vitamin B12 deficiency anemia due to intrinsic factor deficiency: Secondary | ICD-10-CM | POA: Diagnosis not present

## 2022-09-13 DIAGNOSIS — A419 Sepsis, unspecified organism: Secondary | ICD-10-CM | POA: Diagnosis not present

## 2022-09-13 DIAGNOSIS — I700 Atherosclerosis of aorta: Secondary | ICD-10-CM | POA: Diagnosis not present

## 2022-09-13 DIAGNOSIS — I5032 Chronic diastolic (congestive) heart failure: Secondary | ICD-10-CM | POA: Diagnosis not present

## 2022-09-13 DIAGNOSIS — E7800 Pure hypercholesterolemia, unspecified: Secondary | ICD-10-CM | POA: Diagnosis not present

## 2022-09-13 DIAGNOSIS — I2510 Atherosclerotic heart disease of native coronary artery without angina pectoris: Secondary | ICD-10-CM | POA: Diagnosis not present

## 2022-09-13 DIAGNOSIS — M1990 Unspecified osteoarthritis, unspecified site: Secondary | ICD-10-CM | POA: Diagnosis not present

## 2022-09-13 DIAGNOSIS — J440 Chronic obstructive pulmonary disease with acute lower respiratory infection: Secondary | ICD-10-CM | POA: Diagnosis not present

## 2022-09-13 DIAGNOSIS — I130 Hypertensive heart and chronic kidney disease with heart failure and stage 1 through stage 4 chronic kidney disease, or unspecified chronic kidney disease: Secondary | ICD-10-CM | POA: Diagnosis not present

## 2022-09-13 DIAGNOSIS — N184 Chronic kidney disease, stage 4 (severe): Secondary | ICD-10-CM | POA: Diagnosis not present

## 2022-09-13 DIAGNOSIS — S51001D Unspecified open wound of right elbow, subsequent encounter: Secondary | ICD-10-CM | POA: Diagnosis not present

## 2022-09-15 DIAGNOSIS — D631 Anemia in chronic kidney disease: Secondary | ICD-10-CM | POA: Diagnosis not present

## 2022-09-15 DIAGNOSIS — K219 Gastro-esophageal reflux disease without esophagitis: Secondary | ICD-10-CM | POA: Diagnosis not present

## 2022-09-15 DIAGNOSIS — M1990 Unspecified osteoarthritis, unspecified site: Secondary | ICD-10-CM | POA: Diagnosis not present

## 2022-09-15 DIAGNOSIS — M5137 Other intervertebral disc degeneration, lumbosacral region: Secondary | ICD-10-CM | POA: Diagnosis not present

## 2022-09-15 DIAGNOSIS — S51001D Unspecified open wound of right elbow, subsequent encounter: Secondary | ICD-10-CM | POA: Diagnosis not present

## 2022-09-15 DIAGNOSIS — G2581 Restless legs syndrome: Secondary | ICD-10-CM | POA: Diagnosis not present

## 2022-09-15 DIAGNOSIS — D510 Vitamin B12 deficiency anemia due to intrinsic factor deficiency: Secondary | ICD-10-CM | POA: Diagnosis not present

## 2022-09-15 DIAGNOSIS — J441 Chronic obstructive pulmonary disease with (acute) exacerbation: Secondary | ICD-10-CM | POA: Diagnosis not present

## 2022-09-15 DIAGNOSIS — I130 Hypertensive heart and chronic kidney disease with heart failure and stage 1 through stage 4 chronic kidney disease, or unspecified chronic kidney disease: Secondary | ICD-10-CM | POA: Diagnosis not present

## 2022-09-15 DIAGNOSIS — I700 Atherosclerosis of aorta: Secondary | ICD-10-CM | POA: Diagnosis not present

## 2022-09-15 DIAGNOSIS — I5032 Chronic diastolic (congestive) heart failure: Secondary | ICD-10-CM | POA: Diagnosis not present

## 2022-09-15 DIAGNOSIS — E039 Hypothyroidism, unspecified: Secondary | ICD-10-CM | POA: Diagnosis not present

## 2022-09-15 DIAGNOSIS — A419 Sepsis, unspecified organism: Secondary | ICD-10-CM | POA: Diagnosis not present

## 2022-09-15 DIAGNOSIS — E7800 Pure hypercholesterolemia, unspecified: Secondary | ICD-10-CM | POA: Diagnosis not present

## 2022-09-15 DIAGNOSIS — I482 Chronic atrial fibrillation, unspecified: Secondary | ICD-10-CM | POA: Diagnosis not present

## 2022-09-15 DIAGNOSIS — J440 Chronic obstructive pulmonary disease with acute lower respiratory infection: Secondary | ICD-10-CM | POA: Diagnosis not present

## 2022-09-15 DIAGNOSIS — N184 Chronic kidney disease, stage 4 (severe): Secondary | ICD-10-CM | POA: Diagnosis not present

## 2022-09-15 DIAGNOSIS — E876 Hypokalemia: Secondary | ICD-10-CM | POA: Diagnosis not present

## 2022-09-15 DIAGNOSIS — J9621 Acute and chronic respiratory failure with hypoxia: Secondary | ICD-10-CM | POA: Diagnosis not present

## 2022-09-15 DIAGNOSIS — I2510 Atherosclerotic heart disease of native coronary artery without angina pectoris: Secondary | ICD-10-CM | POA: Diagnosis not present

## 2022-09-16 DIAGNOSIS — N184 Chronic kidney disease, stage 4 (severe): Secondary | ICD-10-CM | POA: Diagnosis not present

## 2022-09-16 DIAGNOSIS — I482 Chronic atrial fibrillation, unspecified: Secondary | ICD-10-CM | POA: Diagnosis not present

## 2022-09-16 DIAGNOSIS — I5032 Chronic diastolic (congestive) heart failure: Secondary | ICD-10-CM | POA: Diagnosis not present

## 2022-09-16 DIAGNOSIS — E7800 Pure hypercholesterolemia, unspecified: Secondary | ICD-10-CM | POA: Diagnosis not present

## 2022-09-16 DIAGNOSIS — M5137 Other intervertebral disc degeneration, lumbosacral region: Secondary | ICD-10-CM | POA: Diagnosis not present

## 2022-09-16 DIAGNOSIS — J441 Chronic obstructive pulmonary disease with (acute) exacerbation: Secondary | ICD-10-CM | POA: Diagnosis not present

## 2022-09-16 DIAGNOSIS — G2581 Restless legs syndrome: Secondary | ICD-10-CM | POA: Diagnosis not present

## 2022-09-16 DIAGNOSIS — E039 Hypothyroidism, unspecified: Secondary | ICD-10-CM | POA: Diagnosis not present

## 2022-09-16 DIAGNOSIS — I130 Hypertensive heart and chronic kidney disease with heart failure and stage 1 through stage 4 chronic kidney disease, or unspecified chronic kidney disease: Secondary | ICD-10-CM | POA: Diagnosis not present

## 2022-09-16 DIAGNOSIS — D510 Vitamin B12 deficiency anemia due to intrinsic factor deficiency: Secondary | ICD-10-CM | POA: Diagnosis not present

## 2022-09-16 DIAGNOSIS — I700 Atherosclerosis of aorta: Secondary | ICD-10-CM | POA: Diagnosis not present

## 2022-09-16 DIAGNOSIS — M1990 Unspecified osteoarthritis, unspecified site: Secondary | ICD-10-CM | POA: Diagnosis not present

## 2022-09-16 DIAGNOSIS — J9621 Acute and chronic respiratory failure with hypoxia: Secondary | ICD-10-CM | POA: Diagnosis not present

## 2022-09-16 DIAGNOSIS — I2510 Atherosclerotic heart disease of native coronary artery without angina pectoris: Secondary | ICD-10-CM | POA: Diagnosis not present

## 2022-09-16 DIAGNOSIS — K219 Gastro-esophageal reflux disease without esophagitis: Secondary | ICD-10-CM | POA: Diagnosis not present

## 2022-09-16 DIAGNOSIS — E876 Hypokalemia: Secondary | ICD-10-CM | POA: Diagnosis not present

## 2022-09-16 DIAGNOSIS — A419 Sepsis, unspecified organism: Secondary | ICD-10-CM | POA: Diagnosis not present

## 2022-09-16 DIAGNOSIS — J440 Chronic obstructive pulmonary disease with acute lower respiratory infection: Secondary | ICD-10-CM | POA: Diagnosis not present

## 2022-09-16 DIAGNOSIS — S51001D Unspecified open wound of right elbow, subsequent encounter: Secondary | ICD-10-CM | POA: Diagnosis not present

## 2022-09-16 DIAGNOSIS — D631 Anemia in chronic kidney disease: Secondary | ICD-10-CM | POA: Diagnosis not present

## 2022-09-21 DIAGNOSIS — N184 Chronic kidney disease, stage 4 (severe): Secondary | ICD-10-CM | POA: Diagnosis not present

## 2022-09-21 DIAGNOSIS — D510 Vitamin B12 deficiency anemia due to intrinsic factor deficiency: Secondary | ICD-10-CM | POA: Diagnosis not present

## 2022-09-21 DIAGNOSIS — M5137 Other intervertebral disc degeneration, lumbosacral region: Secondary | ICD-10-CM | POA: Diagnosis not present

## 2022-09-21 DIAGNOSIS — K219 Gastro-esophageal reflux disease without esophagitis: Secondary | ICD-10-CM | POA: Diagnosis not present

## 2022-09-21 DIAGNOSIS — J441 Chronic obstructive pulmonary disease with (acute) exacerbation: Secondary | ICD-10-CM | POA: Diagnosis not present

## 2022-09-21 DIAGNOSIS — S51001D Unspecified open wound of right elbow, subsequent encounter: Secondary | ICD-10-CM | POA: Diagnosis not present

## 2022-09-21 DIAGNOSIS — I5032 Chronic diastolic (congestive) heart failure: Secondary | ICD-10-CM | POA: Diagnosis not present

## 2022-09-21 DIAGNOSIS — I2510 Atherosclerotic heart disease of native coronary artery without angina pectoris: Secondary | ICD-10-CM | POA: Diagnosis not present

## 2022-09-21 DIAGNOSIS — E876 Hypokalemia: Secondary | ICD-10-CM | POA: Diagnosis not present

## 2022-09-21 DIAGNOSIS — D631 Anemia in chronic kidney disease: Secondary | ICD-10-CM | POA: Diagnosis not present

## 2022-09-21 DIAGNOSIS — J440 Chronic obstructive pulmonary disease with acute lower respiratory infection: Secondary | ICD-10-CM | POA: Diagnosis not present

## 2022-09-21 DIAGNOSIS — I482 Chronic atrial fibrillation, unspecified: Secondary | ICD-10-CM | POA: Diagnosis not present

## 2022-09-21 DIAGNOSIS — J9621 Acute and chronic respiratory failure with hypoxia: Secondary | ICD-10-CM | POA: Diagnosis not present

## 2022-09-21 DIAGNOSIS — E7800 Pure hypercholesterolemia, unspecified: Secondary | ICD-10-CM | POA: Diagnosis not present

## 2022-09-21 DIAGNOSIS — M1990 Unspecified osteoarthritis, unspecified site: Secondary | ICD-10-CM | POA: Diagnosis not present

## 2022-09-21 DIAGNOSIS — A419 Sepsis, unspecified organism: Secondary | ICD-10-CM | POA: Diagnosis not present

## 2022-09-21 DIAGNOSIS — I130 Hypertensive heart and chronic kidney disease with heart failure and stage 1 through stage 4 chronic kidney disease, or unspecified chronic kidney disease: Secondary | ICD-10-CM | POA: Diagnosis not present

## 2022-09-21 DIAGNOSIS — G2581 Restless legs syndrome: Secondary | ICD-10-CM | POA: Diagnosis not present

## 2022-09-21 DIAGNOSIS — E039 Hypothyroidism, unspecified: Secondary | ICD-10-CM | POA: Diagnosis not present

## 2022-09-21 DIAGNOSIS — I700 Atherosclerosis of aorta: Secondary | ICD-10-CM | POA: Diagnosis not present

## 2022-09-23 DIAGNOSIS — M1990 Unspecified osteoarthritis, unspecified site: Secondary | ICD-10-CM | POA: Diagnosis not present

## 2022-09-23 DIAGNOSIS — N184 Chronic kidney disease, stage 4 (severe): Secondary | ICD-10-CM | POA: Diagnosis not present

## 2022-09-23 DIAGNOSIS — D510 Vitamin B12 deficiency anemia due to intrinsic factor deficiency: Secondary | ICD-10-CM | POA: Diagnosis not present

## 2022-09-23 DIAGNOSIS — J440 Chronic obstructive pulmonary disease with acute lower respiratory infection: Secondary | ICD-10-CM | POA: Diagnosis not present

## 2022-09-23 DIAGNOSIS — D631 Anemia in chronic kidney disease: Secondary | ICD-10-CM | POA: Diagnosis not present

## 2022-09-23 DIAGNOSIS — I2510 Atherosclerotic heart disease of native coronary artery without angina pectoris: Secondary | ICD-10-CM | POA: Diagnosis not present

## 2022-09-23 DIAGNOSIS — I130 Hypertensive heart and chronic kidney disease with heart failure and stage 1 through stage 4 chronic kidney disease, or unspecified chronic kidney disease: Secondary | ICD-10-CM | POA: Diagnosis not present

## 2022-09-23 DIAGNOSIS — E876 Hypokalemia: Secondary | ICD-10-CM | POA: Diagnosis not present

## 2022-09-23 DIAGNOSIS — I700 Atherosclerosis of aorta: Secondary | ICD-10-CM | POA: Diagnosis not present

## 2022-09-23 DIAGNOSIS — K219 Gastro-esophageal reflux disease without esophagitis: Secondary | ICD-10-CM | POA: Diagnosis not present

## 2022-09-23 DIAGNOSIS — J441 Chronic obstructive pulmonary disease with (acute) exacerbation: Secondary | ICD-10-CM | POA: Diagnosis not present

## 2022-09-23 DIAGNOSIS — I5032 Chronic diastolic (congestive) heart failure: Secondary | ICD-10-CM | POA: Diagnosis not present

## 2022-09-23 DIAGNOSIS — J9621 Acute and chronic respiratory failure with hypoxia: Secondary | ICD-10-CM | POA: Diagnosis not present

## 2022-09-23 DIAGNOSIS — E7800 Pure hypercholesterolemia, unspecified: Secondary | ICD-10-CM | POA: Diagnosis not present

## 2022-09-23 DIAGNOSIS — G2581 Restless legs syndrome: Secondary | ICD-10-CM | POA: Diagnosis not present

## 2022-09-23 DIAGNOSIS — M5137 Other intervertebral disc degeneration, lumbosacral region: Secondary | ICD-10-CM | POA: Diagnosis not present

## 2022-09-23 DIAGNOSIS — E039 Hypothyroidism, unspecified: Secondary | ICD-10-CM | POA: Diagnosis not present

## 2022-09-23 DIAGNOSIS — A419 Sepsis, unspecified organism: Secondary | ICD-10-CM | POA: Diagnosis not present

## 2022-09-23 DIAGNOSIS — I482 Chronic atrial fibrillation, unspecified: Secondary | ICD-10-CM | POA: Diagnosis not present

## 2022-09-23 DIAGNOSIS — S51001D Unspecified open wound of right elbow, subsequent encounter: Secondary | ICD-10-CM | POA: Diagnosis not present

## 2022-09-28 DIAGNOSIS — J441 Chronic obstructive pulmonary disease with (acute) exacerbation: Secondary | ICD-10-CM | POA: Diagnosis not present

## 2022-09-28 DIAGNOSIS — S51001D Unspecified open wound of right elbow, subsequent encounter: Secondary | ICD-10-CM | POA: Diagnosis not present

## 2022-09-28 DIAGNOSIS — I700 Atherosclerosis of aorta: Secondary | ICD-10-CM | POA: Diagnosis not present

## 2022-09-28 DIAGNOSIS — M5137 Other intervertebral disc degeneration, lumbosacral region: Secondary | ICD-10-CM | POA: Diagnosis not present

## 2022-09-28 DIAGNOSIS — E039 Hypothyroidism, unspecified: Secondary | ICD-10-CM | POA: Diagnosis not present

## 2022-09-28 DIAGNOSIS — J440 Chronic obstructive pulmonary disease with acute lower respiratory infection: Secondary | ICD-10-CM | POA: Diagnosis not present

## 2022-09-28 DIAGNOSIS — E876 Hypokalemia: Secondary | ICD-10-CM | POA: Diagnosis not present

## 2022-09-28 DIAGNOSIS — E7800 Pure hypercholesterolemia, unspecified: Secondary | ICD-10-CM | POA: Diagnosis not present

## 2022-09-28 DIAGNOSIS — I2510 Atherosclerotic heart disease of native coronary artery without angina pectoris: Secondary | ICD-10-CM | POA: Diagnosis not present

## 2022-09-28 DIAGNOSIS — I130 Hypertensive heart and chronic kidney disease with heart failure and stage 1 through stage 4 chronic kidney disease, or unspecified chronic kidney disease: Secondary | ICD-10-CM | POA: Diagnosis not present

## 2022-09-28 DIAGNOSIS — K219 Gastro-esophageal reflux disease without esophagitis: Secondary | ICD-10-CM | POA: Diagnosis not present

## 2022-09-28 DIAGNOSIS — I5032 Chronic diastolic (congestive) heart failure: Secondary | ICD-10-CM | POA: Diagnosis not present

## 2022-09-28 DIAGNOSIS — D510 Vitamin B12 deficiency anemia due to intrinsic factor deficiency: Secondary | ICD-10-CM | POA: Diagnosis not present

## 2022-09-28 DIAGNOSIS — N184 Chronic kidney disease, stage 4 (severe): Secondary | ICD-10-CM | POA: Diagnosis not present

## 2022-09-28 DIAGNOSIS — D631 Anemia in chronic kidney disease: Secondary | ICD-10-CM | POA: Diagnosis not present

## 2022-09-28 DIAGNOSIS — M1990 Unspecified osteoarthritis, unspecified site: Secondary | ICD-10-CM | POA: Diagnosis not present

## 2022-09-28 DIAGNOSIS — G2581 Restless legs syndrome: Secondary | ICD-10-CM | POA: Diagnosis not present

## 2022-09-28 DIAGNOSIS — I482 Chronic atrial fibrillation, unspecified: Secondary | ICD-10-CM | POA: Diagnosis not present

## 2022-09-28 DIAGNOSIS — J9621 Acute and chronic respiratory failure with hypoxia: Secondary | ICD-10-CM | POA: Diagnosis not present

## 2022-09-28 DIAGNOSIS — A419 Sepsis, unspecified organism: Secondary | ICD-10-CM | POA: Diagnosis not present

## 2022-10-08 DIAGNOSIS — D631 Anemia in chronic kidney disease: Secondary | ICD-10-CM | POA: Diagnosis not present

## 2022-10-08 DIAGNOSIS — I5032 Chronic diastolic (congestive) heart failure: Secondary | ICD-10-CM | POA: Diagnosis not present

## 2022-10-08 DIAGNOSIS — I130 Hypertensive heart and chronic kidney disease with heart failure and stage 1 through stage 4 chronic kidney disease, or unspecified chronic kidney disease: Secondary | ICD-10-CM | POA: Diagnosis not present

## 2022-10-08 DIAGNOSIS — M1990 Unspecified osteoarthritis, unspecified site: Secondary | ICD-10-CM | POA: Diagnosis not present

## 2022-10-08 DIAGNOSIS — I482 Chronic atrial fibrillation, unspecified: Secondary | ICD-10-CM | POA: Diagnosis not present

## 2022-10-08 DIAGNOSIS — E876 Hypokalemia: Secondary | ICD-10-CM | POA: Diagnosis not present

## 2022-10-08 DIAGNOSIS — J441 Chronic obstructive pulmonary disease with (acute) exacerbation: Secondary | ICD-10-CM | POA: Diagnosis not present

## 2022-10-08 DIAGNOSIS — M5137 Other intervertebral disc degeneration, lumbosacral region: Secondary | ICD-10-CM | POA: Diagnosis not present

## 2022-10-08 DIAGNOSIS — G2581 Restless legs syndrome: Secondary | ICD-10-CM | POA: Diagnosis not present

## 2022-10-08 DIAGNOSIS — I2510 Atherosclerotic heart disease of native coronary artery without angina pectoris: Secondary | ICD-10-CM | POA: Diagnosis not present

## 2022-10-08 DIAGNOSIS — S51001D Unspecified open wound of right elbow, subsequent encounter: Secondary | ICD-10-CM | POA: Diagnosis not present

## 2022-10-08 DIAGNOSIS — J440 Chronic obstructive pulmonary disease with acute lower respiratory infection: Secondary | ICD-10-CM | POA: Diagnosis not present

## 2022-10-08 DIAGNOSIS — D510 Vitamin B12 deficiency anemia due to intrinsic factor deficiency: Secondary | ICD-10-CM | POA: Diagnosis not present

## 2022-10-08 DIAGNOSIS — E039 Hypothyroidism, unspecified: Secondary | ICD-10-CM | POA: Diagnosis not present

## 2022-10-08 DIAGNOSIS — K219 Gastro-esophageal reflux disease without esophagitis: Secondary | ICD-10-CM | POA: Diagnosis not present

## 2022-10-08 DIAGNOSIS — A419 Sepsis, unspecified organism: Secondary | ICD-10-CM | POA: Diagnosis not present

## 2022-10-08 DIAGNOSIS — I700 Atherosclerosis of aorta: Secondary | ICD-10-CM | POA: Diagnosis not present

## 2022-10-08 DIAGNOSIS — N184 Chronic kidney disease, stage 4 (severe): Secondary | ICD-10-CM | POA: Diagnosis not present

## 2022-10-08 DIAGNOSIS — J9621 Acute and chronic respiratory failure with hypoxia: Secondary | ICD-10-CM | POA: Diagnosis not present

## 2022-10-08 DIAGNOSIS — E7800 Pure hypercholesterolemia, unspecified: Secondary | ICD-10-CM | POA: Diagnosis not present

## 2022-10-12 DIAGNOSIS — G2581 Restless legs syndrome: Secondary | ICD-10-CM | POA: Diagnosis not present

## 2022-10-12 DIAGNOSIS — I5032 Chronic diastolic (congestive) heart failure: Secondary | ICD-10-CM | POA: Diagnosis not present

## 2022-10-12 DIAGNOSIS — N184 Chronic kidney disease, stage 4 (severe): Secondary | ICD-10-CM | POA: Diagnosis not present

## 2022-10-12 DIAGNOSIS — M5137 Other intervertebral disc degeneration, lumbosacral region: Secondary | ICD-10-CM | POA: Diagnosis not present

## 2022-10-12 DIAGNOSIS — D638 Anemia in other chronic diseases classified elsewhere: Secondary | ICD-10-CM | POA: Diagnosis not present

## 2022-10-12 DIAGNOSIS — D519 Vitamin B12 deficiency anemia, unspecified: Secondary | ICD-10-CM | POA: Diagnosis not present

## 2022-10-12 DIAGNOSIS — I130 Hypertensive heart and chronic kidney disease with heart failure and stage 1 through stage 4 chronic kidney disease, or unspecified chronic kidney disease: Secondary | ICD-10-CM | POA: Diagnosis not present

## 2022-10-12 DIAGNOSIS — I482 Chronic atrial fibrillation, unspecified: Secondary | ICD-10-CM | POA: Diagnosis not present

## 2022-10-12 DIAGNOSIS — R21 Rash and other nonspecific skin eruption: Secondary | ICD-10-CM | POA: Diagnosis not present

## 2022-10-12 DIAGNOSIS — E785 Hyperlipidemia, unspecified: Secondary | ICD-10-CM | POA: Diagnosis not present

## 2022-10-12 DIAGNOSIS — J449 Chronic obstructive pulmonary disease, unspecified: Secondary | ICD-10-CM | POA: Diagnosis not present

## 2022-10-12 DIAGNOSIS — E039 Hypothyroidism, unspecified: Secondary | ICD-10-CM | POA: Diagnosis not present

## 2022-10-13 DIAGNOSIS — I482 Chronic atrial fibrillation, unspecified: Secondary | ICD-10-CM | POA: Diagnosis not present

## 2022-10-13 DIAGNOSIS — I700 Atherosclerosis of aorta: Secondary | ICD-10-CM | POA: Diagnosis not present

## 2022-10-13 DIAGNOSIS — J440 Chronic obstructive pulmonary disease with acute lower respiratory infection: Secondary | ICD-10-CM | POA: Diagnosis not present

## 2022-10-13 DIAGNOSIS — I2510 Atherosclerotic heart disease of native coronary artery without angina pectoris: Secondary | ICD-10-CM | POA: Diagnosis not present

## 2022-10-13 DIAGNOSIS — D631 Anemia in chronic kidney disease: Secondary | ICD-10-CM | POA: Diagnosis not present

## 2022-10-13 DIAGNOSIS — I130 Hypertensive heart and chronic kidney disease with heart failure and stage 1 through stage 4 chronic kidney disease, or unspecified chronic kidney disease: Secondary | ICD-10-CM | POA: Diagnosis not present

## 2022-10-13 DIAGNOSIS — G2581 Restless legs syndrome: Secondary | ICD-10-CM | POA: Diagnosis not present

## 2022-10-13 DIAGNOSIS — J9621 Acute and chronic respiratory failure with hypoxia: Secondary | ICD-10-CM | POA: Diagnosis not present

## 2022-10-13 DIAGNOSIS — E876 Hypokalemia: Secondary | ICD-10-CM | POA: Diagnosis not present

## 2022-10-13 DIAGNOSIS — A419 Sepsis, unspecified organism: Secondary | ICD-10-CM | POA: Diagnosis not present

## 2022-10-13 DIAGNOSIS — M5137 Other intervertebral disc degeneration, lumbosacral region: Secondary | ICD-10-CM | POA: Diagnosis not present

## 2022-10-13 DIAGNOSIS — S51001D Unspecified open wound of right elbow, subsequent encounter: Secondary | ICD-10-CM | POA: Diagnosis not present

## 2022-10-13 DIAGNOSIS — M1990 Unspecified osteoarthritis, unspecified site: Secondary | ICD-10-CM | POA: Diagnosis not present

## 2022-10-13 DIAGNOSIS — E039 Hypothyroidism, unspecified: Secondary | ICD-10-CM | POA: Diagnosis not present

## 2022-10-13 DIAGNOSIS — J441 Chronic obstructive pulmonary disease with (acute) exacerbation: Secondary | ICD-10-CM | POA: Diagnosis not present

## 2022-10-13 DIAGNOSIS — E7800 Pure hypercholesterolemia, unspecified: Secondary | ICD-10-CM | POA: Diagnosis not present

## 2022-10-13 DIAGNOSIS — K219 Gastro-esophageal reflux disease without esophagitis: Secondary | ICD-10-CM | POA: Diagnosis not present

## 2022-10-13 DIAGNOSIS — I5032 Chronic diastolic (congestive) heart failure: Secondary | ICD-10-CM | POA: Diagnosis not present

## 2022-10-13 DIAGNOSIS — D510 Vitamin B12 deficiency anemia due to intrinsic factor deficiency: Secondary | ICD-10-CM | POA: Diagnosis not present

## 2022-10-13 DIAGNOSIS — N184 Chronic kidney disease, stage 4 (severe): Secondary | ICD-10-CM | POA: Diagnosis not present

## 2022-10-20 DIAGNOSIS — M5137 Other intervertebral disc degeneration, lumbosacral region: Secondary | ICD-10-CM | POA: Diagnosis not present

## 2022-10-20 DIAGNOSIS — D510 Vitamin B12 deficiency anemia due to intrinsic factor deficiency: Secondary | ICD-10-CM | POA: Diagnosis not present

## 2022-10-20 DIAGNOSIS — G2581 Restless legs syndrome: Secondary | ICD-10-CM | POA: Diagnosis not present

## 2022-10-20 DIAGNOSIS — K219 Gastro-esophageal reflux disease without esophagitis: Secondary | ICD-10-CM | POA: Diagnosis not present

## 2022-10-20 DIAGNOSIS — I130 Hypertensive heart and chronic kidney disease with heart failure and stage 1 through stage 4 chronic kidney disease, or unspecified chronic kidney disease: Secondary | ICD-10-CM | POA: Diagnosis not present

## 2022-10-20 DIAGNOSIS — E876 Hypokalemia: Secondary | ICD-10-CM | POA: Diagnosis not present

## 2022-10-20 DIAGNOSIS — I2510 Atherosclerotic heart disease of native coronary artery without angina pectoris: Secondary | ICD-10-CM | POA: Diagnosis not present

## 2022-10-20 DIAGNOSIS — D631 Anemia in chronic kidney disease: Secondary | ICD-10-CM | POA: Diagnosis not present

## 2022-10-20 DIAGNOSIS — A419 Sepsis, unspecified organism: Secondary | ICD-10-CM | POA: Diagnosis not present

## 2022-10-20 DIAGNOSIS — I700 Atherosclerosis of aorta: Secondary | ICD-10-CM | POA: Diagnosis not present

## 2022-10-20 DIAGNOSIS — J440 Chronic obstructive pulmonary disease with acute lower respiratory infection: Secondary | ICD-10-CM | POA: Diagnosis not present

## 2022-10-20 DIAGNOSIS — I482 Chronic atrial fibrillation, unspecified: Secondary | ICD-10-CM | POA: Diagnosis not present

## 2022-10-20 DIAGNOSIS — J9621 Acute and chronic respiratory failure with hypoxia: Secondary | ICD-10-CM | POA: Diagnosis not present

## 2022-10-20 DIAGNOSIS — J441 Chronic obstructive pulmonary disease with (acute) exacerbation: Secondary | ICD-10-CM | POA: Diagnosis not present

## 2022-10-20 DIAGNOSIS — I5032 Chronic diastolic (congestive) heart failure: Secondary | ICD-10-CM | POA: Diagnosis not present

## 2022-10-20 DIAGNOSIS — M1990 Unspecified osteoarthritis, unspecified site: Secondary | ICD-10-CM | POA: Diagnosis not present

## 2022-10-20 DIAGNOSIS — N184 Chronic kidney disease, stage 4 (severe): Secondary | ICD-10-CM | POA: Diagnosis not present

## 2022-10-20 DIAGNOSIS — E039 Hypothyroidism, unspecified: Secondary | ICD-10-CM | POA: Diagnosis not present

## 2022-10-20 DIAGNOSIS — E7800 Pure hypercholesterolemia, unspecified: Secondary | ICD-10-CM | POA: Diagnosis not present

## 2022-10-20 DIAGNOSIS — S51001D Unspecified open wound of right elbow, subsequent encounter: Secondary | ICD-10-CM | POA: Diagnosis not present

## 2022-10-23 DIAGNOSIS — J9621 Acute and chronic respiratory failure with hypoxia: Secondary | ICD-10-CM | POA: Diagnosis not present

## 2022-10-23 DIAGNOSIS — J449 Chronic obstructive pulmonary disease, unspecified: Secondary | ICD-10-CM | POA: Diagnosis not present

## 2022-10-27 ENCOUNTER — Other Ambulatory Visit: Payer: Self-pay | Admitting: Cardiology

## 2022-10-27 DIAGNOSIS — Z7901 Long term (current) use of anticoagulants: Secondary | ICD-10-CM

## 2022-10-27 DIAGNOSIS — I4811 Longstanding persistent atrial fibrillation: Secondary | ICD-10-CM

## 2022-10-28 NOTE — Telephone Encounter (Signed)
Prescription refill request for Eliquis received. Indication: AF Last office visit: 04/06/21  Rinaldo Cloud MD Scr: 1.97 on 12/31/21 Age:  87 Weight: 77.9kg  Based on above findings Eliquis 2.5mg  twice daily is the appropriate dose.  Patient is past due for appt with Dr Bettina Gavia.  Message sent to schedulers to make appt.  Refill denied.

## 2022-10-29 NOTE — Telephone Encounter (Signed)
Pt needs appt with Dr Dulce Sellar.  Past due.  Has not been seen since 2022.  Please schedule.

## 2022-11-08 ENCOUNTER — Telehealth: Payer: Self-pay | Admitting: Cardiology

## 2022-11-08 DIAGNOSIS — Z7901 Long term (current) use of anticoagulants: Secondary | ICD-10-CM

## 2022-11-08 DIAGNOSIS — I4811 Longstanding persistent atrial fibrillation: Secondary | ICD-10-CM

## 2022-11-08 NOTE — Telephone Encounter (Signed)
*  STAT* If patient is at the pharmacy, call can be transferred to refill team.   1. Which medications need to be refilled? (please list name of each medication and dose if known) apixaban (ELIQUIS) 2.5 MG TABS tablet   2. Which pharmacy/location (including street and city if local pharmacy) is medication to be sent to?  [facility] - [place], [place] - [address]    3. Do they need a 30 day or 90 day supply? 90 day

## 2022-11-09 MED ORDER — APIXABAN 2.5 MG PO TABS: 2.5000 mg | ORAL_TABLET | Freq: Two times a day (BID) | ORAL | 0 refills | Status: DC

## 2022-11-09 NOTE — Telephone Encounter (Signed)
Pt last saw Dr Dulce Sellar 04/06/21, pt is overdue for follow-up.  Pt has a follow-up appt scheduled with Dr Dulce Sellar on 01/25/23.  Last labs 12/31/21 Creat 1.97, age 87, weight 77.9kg, based on specified criteria pt is on appropriate dosage of Eliquis 2.5mg  BID for afib.  Will refill rx to get pt to upcoming appt.

## 2022-11-19 DIAGNOSIS — L02414 Cutaneous abscess of left upper limb: Secondary | ICD-10-CM | POA: Diagnosis not present

## 2022-11-23 ENCOUNTER — Encounter: Payer: Self-pay | Admitting: Cardiology

## 2022-11-23 ENCOUNTER — Ambulatory Visit: Payer: Medicare Other | Attending: Cardiology | Admitting: Cardiology

## 2022-11-23 VITALS — BP 108/68 | HR 88 | Ht 64.0 in | Wt 162.0 lb

## 2022-11-23 DIAGNOSIS — I130 Hypertensive heart and chronic kidney disease with heart failure and stage 1 through stage 4 chronic kidney disease, or unspecified chronic kidney disease: Secondary | ICD-10-CM

## 2022-11-23 DIAGNOSIS — I4891 Unspecified atrial fibrillation: Secondary | ICD-10-CM | POA: Diagnosis not present

## 2022-11-23 DIAGNOSIS — J449 Chronic obstructive pulmonary disease, unspecified: Secondary | ICD-10-CM | POA: Diagnosis not present

## 2022-11-23 DIAGNOSIS — Z7901 Long term (current) use of anticoagulants: Secondary | ICD-10-CM

## 2022-11-23 DIAGNOSIS — I5032 Chronic diastolic (congestive) heart failure: Secondary | ICD-10-CM | POA: Diagnosis not present

## 2022-11-23 DIAGNOSIS — I4821 Permanent atrial fibrillation: Secondary | ICD-10-CM | POA: Diagnosis not present

## 2022-11-23 DIAGNOSIS — S52502A Unspecified fracture of the lower end of left radius, initial encounter for closed fracture: Secondary | ICD-10-CM | POA: Diagnosis not present

## 2022-11-23 NOTE — Progress Notes (Signed)
Cardiology Office Note:    Date:  11/23/2022   ID:  Melanie Montes, DOB 05-22-1936, MRN [id number]  PCP:  Paulina Fusi, MD  Cardiologist:  Norman Herrlich, MD    Referring MD: Paulina Fusi, MD    ASSESSMENT:    1. Permanent atrial fibrillation (HCC)   2. Chronic anticoagulation   3. Hypertensive heart and chronic kidney disease with heart failure and stage 1 through stage 4 chronic kidney disease, or unspecified chronic kidney disease (HCC)    PLAN:    In order of problems listed above:  Previously on amiodarone for rate currently controlled with beta-blocker and takes reduced dose anticoagulant Stable continue with current diuretic Her daughter is present there is some confusion whether the pharmacy did not refill one of her medications and her daughter will go home to research this and let us know how   Next appointment: 6 months   Medication Adjustments/Labs and Tests Ordered: Current medicines are reviewed at length with the patient today.  Concerns regarding medicines are outlined above.  No orders of the defined types were placed in this encounter.  No orders of the defined types were placed in this encounter.   Chief Complaint  Patient presents with   Follow-up   Atrial Fibrillation   Congestive Heart Failure    History of Present Illness:    Melanie Montes is a 87 y.o. female with a hx of permanent atrial fibrillation chronic anticoagulation hypertensive heart and kidney disease last seen 04/06/2021 remaining in atrial fibrillation previously had been on amiodarone.  She is on reduced dose anticoagulant with age and elevated creatinine.  She had an EKG performed at [facility] 08/08/2022 showing atrial fibrillation normal ejection fraction 55 to 60% mild left and right atrial enlargement and mild mitral and aortic regurgitation.  She is admitted to [facility] 08/18/2022 for 1 week with influenza A pneumonia with respiratory failure  delirium. Laboratory test showed at discharge her hemoglobin was 9.7 creatinine 1.3 potassium 1.8.  Troponin was undetectable proBNP level was elevated early in admission greater than 90 12,000 presentation in the hospital heart rate was less than 100 bpm does not appear to have been seen by cardiology during this admission her EKG independently reviewed by me shows atrial fibrillation with a controlled ventricular rate nonspecific T wave changes.  Compliance with diet, lifestyle and medications: Yes  Unfortunately she is at bingo agonist action tremor wrist On the eyes no edema shortness of breath chest pain palpitations syncope and no bleeding from her current anticoagulant Past Medical History:  Diagnosis Date   Abnormal CT scan, chest 05/02/2012   Followed in Pulmonary clinic/ [facility]/ [facility]    - CT Chest 04/17/12:  Bilateral clustered nodules both upper lobes and sup segment of lower lobes      Anemia, pernicious    Breast cancer (HCC)    Left   Breast cancer of upper-outer quadrant of left female breast (HCC) 01/19/2016   Carotid artery occlusion without infarction    Carotid stenosis 05/17/2018   Chest pain in adult 06/30/2015   Overview:  Overview:  Stress echo negative for ischemia at 7 mets July 2016 Overview:  Stress echo negative for ischemia at 7 mets July 2016   Chronic diastolic heart failure (HCC) 06/30/2015   CKD (chronic kidney disease) 06/30/2015   Colonic polyp    COPD (chronic obstructive pulmonary disease) (HCC) 06/02/2012   Followed in Pulmonary clinic/ [facility]/ [place]    -  PFT's 06/02/2012 FEV1  1.72 (86%) ratio 68 with marked air trapping  And DLCO 67%    Cough 05/02/2012   Followed in Pulmonary clinic/ [facility]/ [place]     Degeneration of lumbar or lumbosacral intervertebral disc    Edema    Fibrocystic breast changes, bilateral 01/23/2018   GERD (gastroesophageal reflux disease)    HTN (hypertension), benign    Hyperlipidemia    Hypertensive  heart and chronic kidney disease with heart failure and stage 1 through stage 4 chronic kidney disease, or unspecified chronic kidney disease (HCC) 06/30/2015   Hypothyroidism    Paroxysmal atrial fibrillation (HCC)    PAT (paroxysmal atrial tachycardia) 12/13/2017   Restless legs     Past Surgical History:  Procedure Laterality Date   ABDOMINAL HYSTERECTOMY  1970   BREAST MASS EXCISION  2009   left//malignant   CARDIOVERSION N/A 03/09/2018   Procedure: CARDIOVERSION;  Surgeon: Laurey Morale, MD;  Location: [facility] ENDOSCOPY;  Service: Cardiovascular;  Laterality: N/A;   CARDIOVERSION N/A 08/04/2018   Procedure: CARDIOVERSION;  Surgeon: Quintella Reichert, MD;  Location: [facility] ENDOSCOPY;  Service: Cardiovascular;  Laterality: N/A;   COLONOSCOPY  03/27/2020   SPINE SURGERY  1980   lumbar    Current Medications: Current Meds  Medication Sig   Albuterol Sulfate (PROAIR RESPICLICK) 108 (90 Base) MCG/ACT AEPB Inhale 2 puffs into the lungs every 6 (six) hours as needed (shortness of breath).    apixaban (ELIQUIS) 2.5 MG TABS tablet Take 1 tablet (2.5 mg total) by mouth 2 (two) times daily. OVERDUE for follow-up, MUST see MD for FUTURE refills.   escitalopram (LEXAPRO) 10 MG tablet Take 10 mg by mouth daily.   ezetimibe (ZETIA) 10 MG tablet Take 10 mg by mouth daily.   gabapentin (NEURONTIN) 300 MG capsule Take 300 mg by mouth at bedtime.   hydrOXYzine (ATARAX/VISTARIL) 25 MG tablet 25 mg every 6 (six) hours as needed for anxiety.   levothyroxine (SYNTHROID) 75 MCG tablet Take 75 mcg by mouth daily.   metoprolol succinate (TOPROL-XL) 50 MG 24 hr tablet Take 50 mg by mouth 2 (two) times daily.   nitroGLYCERIN (NITROSTAT) 0.4 MG SL tablet Place 1 tablet (0.4 mg total) under the tongue every 5 (five) minutes as needed for chest pain.   pramipexole (MIRAPEX) 0.75 MG tablet Take 0.75 mg by mouth at bedtime.   torsemide (DEMADEX) 20 MG tablet TAKE 1 TABLET BY MOUTH 2 TIMES DAILY. (Patient taking  differently: Takes 2 tablets (40 mg) every am and 1 tablet (20 mg) every pm)     Allergies:   Penicillins and Statins   Social History   Socioeconomic History   Marital status: Unknown    Spouse name: Not on file   Number of children: Not on file   Years of education: Not on file   Highest education level: Not on file  Occupational History   Occupation: retired  Tobacco Use   Smoking status: Former    Packs/day: 1.00    Years: 20.00    Additional pack years: 0.00    Total pack years: 20.00    Types: Cigarettes    Quit date: 07/26/2000    Years since quitting: 22.3   Smokeless tobacco: Never  Vaping Use   Vaping Use: Never used  Substance and Sexual Activity   Alcohol use: No   Drug use: No   Sexual activity: Not on file  Other Topics Concern   Not on file  Social History Narrative   **  Merged History Encounter **       Social Determinants of Health   Financial Resource Strain: Not on file  Food Insecurity: Not on file  Transportation Needs: Not on file  Physical Activity: Not on file  Stress: Not on file  Social Connections: Not on file     Family History: The patient's family history includes Alzheimer's disease in her mother; Aneurysm in her mother; Colon cancer in her father and maternal aunt; Heart disease in her maternal grandmother and paternal grandmother; Stomach cancer in her paternal grandmother; Stroke in her mother; Transient ischemic attack in her father. There is no history of Esophageal cancer or Ulcerative colitis. ROS:   Please see the history of present illness.    All other systems reviewed and are negative.  EKGs/Labs/Other Studies Reviewed:    The following studies were reviewed today:  Cardiac Studies & Procedures     STRESS TESTS  MYOCARDIAL PERFUSION IMAGING 09/26/2019  Narrative  Nuclear stress EF: 67%.  The left ventricular ejection fraction is hyperdynamic (>65%).  There was no ST segment deviation noted during stress.  The  study is normal.  This is a low risk study.  Defect 1: There is a medium defect of moderate severity present in the basal inferior and mid inferior location. Most likely represents soft tissue attenuation.   ECHOCARDIOGRAM  ECHOCARDIOGRAM COMPLETE 02/28/2018  Narrative *[facility]* [address] [place], [place] [address] [phone]  ------------------------------------------------------------------- Transthoracic Echocardiography  Patient:    Melanie, Montes MR #:       [id number] Study Date: 02/28/2018 Gender:     F Age:        82 Height:     162.6 cm Weight:     90.6 kg BSA:        2.06 m^2 Pt. Status: Room:  ATTENDING    Norman Herrlich, MD ORDERING     Norman Herrlich, MD REFERRING    Norman Herrlich, MD SONOGRAPHER  Leta Jungling, RDCS PERFORMING   [facility] [place]  cc:  ------------------------------------------------------------------- LV EF: 55% -   60%  ------------------------------------------------------------------- Indications:      CHF - 428.0.  ------------------------------------------------------------------- History:   PMH:  Chronic Kidney Disease.  Atrial fibrillation. PMH:  Breast Cancer.  Risk factors:  Former tobacco use. Hypertension.  ------------------------------------------------------------------- Study Conclusions  - Left ventricle: The cavity size was normal. Systolic function was normal. The estimated ejection fraction was in the range of 55% to 60%. Wall motion was normal; there were no regional wall motion abnormalities. - Aortic valve: There was trivial regurgitation.  Impressions:  - Normal LVEF. Normal LA size Mild MR. Trace AR  Note: pt was in atrial fibrillation during the study with fast ventricular rate.  ------------------------------------------------------------------- Study data:  No prior study was available for comparison.  Study status:  Routine.  Procedure:  The patient reported no pain pre  or post test. Transthoracic echocardiography. Image quality was adequate.  Study completion:  There were no complications. Transthoracic echocardiography.  M-mode, complete 2D, spectral Doppler, and color Doppler.  Birthdate:  Patient birthdate: [birth year].  Age:  Patient is 87 yr old.  Sex:  Gender: female. BMI: 34.3 kg/m^2.  Blood pressure:     126/78  Patient status: Outpatient.  Study date:  Study date: 02/28/2018. Study time: 10:16 AM.  Location:  Echo laboratory.  -------------------------------------------------------------------  ------------------------------------------------------------------- Left ventricle:  The cavity size was normal. Systolic function was normal. The estimated ejection fraction was in the range of  55% to 60%. Wall motion was normal; there were no regional wall motion abnormalities.  ------------------------------------------------------------------- Aortic valve:   Trileaflet; normal thickness leaflets. Mobility was not restricted.  Doppler:  Transvalvular velocity was within the normal range. There was no stenosis. There was trivial regurgitation.  ------------------------------------------------------------------- Aorta:  Aortic root: The aortic root was normal in size.  ------------------------------------------------------------------- Mitral valve:   Structurally normal valve.   Mobility was not restricted.  Doppler:  Transvalvular velocity was within the normal range. There was no evidence for stenosis. There was no regurgitation.    Valve area by pressure half-time: 4.15 cm^2. Indexed valve area by pressure half-time: 2.01 cm^2/m^2.    Peak gradient (D): 3 mm Hg.  ------------------------------------------------------------------- Left atrium:  The atrium was normal in size.  ------------------------------------------------------------------- Right ventricle:  The cavity size was normal. Wall thickness was normal. Systolic function was  normal.  ------------------------------------------------------------------- Pulmonic valve:    Structurally normal valve.   Cusp separation was normal.  Doppler:  Transvalvular velocity was within the normal range. There was no evidence for stenosis. There was no regurgitation.  ------------------------------------------------------------------- Tricuspid valve:   Structurally normal valve.    Doppler: Transvalvular velocity was within the normal range. There was mild regurgitation.  ------------------------------------------------------------------- Pulmonary artery:   The main pulmonary artery was normal-sized. Systolic pressure was within the normal range.  ------------------------------------------------------------------- Right atrium:  The atrium was normal in size.  ------------------------------------------------------------------- Pericardium:  There was no pericardial effusion.  ------------------------------------------------------------------- Systemic veins: Inferior vena cava: The vessel was normal in size.  ------------------------------------------------------------------- Measurements  Left ventricle                           Value          Reference LV ID, ED, PLAX chordal          (L)     35    mm       43 - 52 LV ID, ES, PLAX chordal                  24    mm       23 - 38 LV fx shortening, PLAX chordal           31    %        >=29 LV PW thickness, ED                      11    mm       ---------- IVS/LV PW ratio, ED                      1.18           <=1.3 Stroke volume, 2D                        27    ml       ---------- Stroke volume/bsa, 2D                    13    ml/m^2   ---------- LV end-diastolic volume, 1-p A4C         37    ml       ---------- LV ejection fraction, 1-p A4C            51    %        ---------- LV  end-diastolic volume/bsa, 1-p         18    ml/m^2   ---------- A4C LV end-diastolic volume, 2-p             57    ml        ---------- LV end-systolic volume, 2-p              30    ml       ---------- LV ejection fraction, 2-p                47    %        ---------- Stroke volume, 2-p                       26    ml       ---------- LV end-diastolic volume/bsa, 2-p         27    ml/m^2   ---------- LV end-systolic volume/bsa, 2-p          15    ml/m^2   ---------- Stroke volume/bsa, 2-p                   12.8  ml/m^2   ---------- LV e&', lateral                           10.9  cm/s     ---------- LV E/e&', lateral                         7.92           ---------- LV e&', medial                            9.03  cm/s     ---------- LV E/e&', medial                          9.56           ---------- LV e&', average                           9.97  cm/s     ---------- LV E/e&', average                         8.66           ----------  Ventricular septum                       Value          Reference IVS thickness, ED                        13    mm       ----------  LVOT                                     Value          Reference LVOT ID, S  17    mm       ---------- LVOT area                                2.27  cm^2     ---------- LVOT peak velocity, S                    67.4  cm/s     ---------- LVOT mean velocity, S                    43.1  cm/s     ---------- LVOT VTI, S                              11.7  cm       ---------- LVOT peak gradient, S                    2     mm Hg    ----------  Aortic valve                             Value          Reference Aortic regurg pressure half-time         605   ms       ----------  Aorta                                    Value          Reference Aortic root ID, ED                       27    mm       ---------- Ascending aorta ID, A-P, S               29    mm       ----------  Left atrium                              Value          Reference LA ID, A-P, ES                           32    mm       ---------- LA ID/bsa, A-P                            1.55  cm/m^2   <=2.2 LA volume, S                             44.3  ml       ---------- LA volume/bsa, S                         21.5  ml/m^2   ---------- LA volume, ES, 1-p A4C                   40.1  ml       ----------  LA volume/bsa, ES, 1-p A4C               19.5  ml/m^2   ---------- LA volume, ES, 1-p A2C                   47.5  ml       ---------- LA volume/bsa, ES, 1-p A2C               23    ml/m^2   ----------  Mitral valve                             Value          Reference Mitral E-wave peak velocity              86.3  cm/s     ---------- Mitral deceleration time                 158   ms       150 - 230 Mitral pressure half-time                46    ms       ---------- Mitral peak gradient, D                  3     mm Hg    ---------- Mitral valve area, PHT, DP               4.15  cm^2     ---------- Mitral valve area/bsa, PHT, DP           2.01  cm^2/m^2 ----------  Pulmonary arteries                       Value          Reference PA pressure, S, DP                       27    mm Hg    <=30  Tricuspid valve                          Value          Reference Tricuspid regurg peak velocity           246   cm/s     ---------- Tricuspid peak RV-RA gradient            24    mm Hg    ----------  Right atrium                             Value          Reference RA ID, S-I, ES, A4C              (H)     49.1  mm       34 - 49 RA area, ES, A4C                         13.5  cm^2     8.3 - 19.5 RA volume, ES, A/L                       30.6  ml       ----------  RA volume/bsa, ES, A/L                   14.8  ml/m^2   ----------  Systemic veins                           Value          Reference Estimated CVP                            3     mm Hg    ----------  Right ventricle                          Value          Reference TAPSE                                    14.6  mm       ---------- RV pressure, S, DP                       27    mm Hg    <=30 RV s&',  lateral, S                        9.36  cm/s     ----------  Legend: (L)  and  (H)  mark values outside specified reference range.  ------------------------------------------------------------------- Prepared and Electronically Authenticated by  Gypsy Balsam, MD 2019-08-06T17:36:25    MONITORS  LONG TERM MONITOR (3-14 DAYS) 11/01/2020  Narrative Patch Wear Time:  3 days and 0 hours (2022-03-28T08:15:28-0400 to 2022-03-31T09:09:54-0400)  Atrial Fibrillation occurred continuously (100% burden), ranging from 62-144 bpm (avg of 96 bpm). Isolated VEs were rare (<1.0%), and no VE Couplets or VE Triplets were present.  The indication was persistent atrial fibrillation. There are no pauses of 3 seconds or greater. There were no triggered and diary events. Ventricular ectopy was rare with rare less than 1% occasional PVCs. The average heart rate in atrial fibrillation 96 bpm heart rate in range daytime 50-110 88% nighttime 97%.  Daytime 12% with rate 110s to 150 bpm.   Conclusion atrial fibrillation with good heart rate control.            Physical Exam:    VS:  BP 108/68 (BP Location: Right Arm, Patient Position: Sitting, Cuff Size: Normal)   Pulse 88   Ht 5\' 4"  (1.626 m)   Wt 162 lb (73.5 kg)   SpO2 94%   BMI 27.81 kg/m     Wt Readings from Last 3 Encounters:  11/23/22 162 lb (73.5 kg)  04/06/21 171 lb 12.8 oz (77.9 kg)  10/08/20 193 lb 3.2 oz (87.6 kg)     GEN:  Well nourished, well developed in no acute distress HEENT: Normal NECK: No JVD; No carotid bruits LYMPHATICS: No lymphadenopathy CARDIAC: Irregular rate and rhythm  RESPIRATORY:  Clear to auscultation without rales, wheezing or rhonchi  ABDOMEN: Soft, non-tender, non-distended MUSCULOSKELETAL:  No edema; No deformity  SKIN: Warm and dry NEUROLOGIC:  Alert and oriented x 3 PSYCHIATRIC:  Normal affect    Signed, Norman Herrlich, MD  11/23/2022 10:54 AM    [facility]

## 2022-11-23 NOTE — Patient Instructions (Signed)
Medication Instructions:  Your physician recommends that you continue on your current medications as directed. Please refer to the Current Medication list given to you today.  *If you need a refill on your cardiac medications before your next appointment, please call your pharmacy*   Lab Work: None If you have labs (blood work) drawn today and your tests are completely normal, you will receive your results only by: MyChart Message (if you have MyChart) OR A paper copy in the mail If you have any lab test that is abnormal or we need to change your treatment, we will call you to review the results.   Testing/Procedures: None   Follow-Up: At [facility], you and your health needs are our priority.  As part of our continuing mission to provide you with exceptional heart care, we have created designated Provider Care Teams.  These Care Teams include your primary Cardiologist (physician) and Advanced Practice Providers (APPs -  Physician Assistants and Nurse Practitioners) who all work together to provide you with the care you need, when you need it.  We recommend signing up for the patient portal called "MyChart".  Sign up information is provided on this After Visit Summary.  MyChart is used to connect with patients for Virtual Visits (Telemedicine).  Patients are able to view lab/test results, encounter notes, upcoming appointments, etc.  Non-urgent messages can be sent to your provider as well.   To learn more about what you can do with MyChart, go to [email or web address].    Your next appointment:   6 month(s)  Provider:   Brian Munley, MD    Other Instructions None  

## 2022-12-14 DIAGNOSIS — S52502D Unspecified fracture of the lower end of left radius, subsequent encounter for closed fracture with routine healing: Secondary | ICD-10-CM | POA: Diagnosis not present

## 2022-12-23 DIAGNOSIS — J449 Chronic obstructive pulmonary disease, unspecified: Secondary | ICD-10-CM | POA: Diagnosis not present

## 2022-12-24 DIAGNOSIS — J449 Chronic obstructive pulmonary disease, unspecified: Secondary | ICD-10-CM | POA: Diagnosis not present

## 2022-12-24 DIAGNOSIS — I5032 Chronic diastolic (congestive) heart failure: Secondary | ICD-10-CM | POA: Diagnosis not present

## 2022-12-24 DIAGNOSIS — I4891 Unspecified atrial fibrillation: Secondary | ICD-10-CM | POA: Diagnosis not present

## 2023-01-06 DIAGNOSIS — S52502D Unspecified fracture of the lower end of left radius, subsequent encounter for closed fracture with routine healing: Secondary | ICD-10-CM | POA: Diagnosis not present

## 2023-01-21 DIAGNOSIS — E039 Hypothyroidism, unspecified: Secondary | ICD-10-CM | POA: Diagnosis not present

## 2023-01-21 DIAGNOSIS — E785 Hyperlipidemia, unspecified: Secondary | ICD-10-CM | POA: Diagnosis not present

## 2023-01-21 DIAGNOSIS — M5137 Other intervertebral disc degeneration, lumbosacral region: Secondary | ICD-10-CM | POA: Diagnosis not present

## 2023-01-21 DIAGNOSIS — D519 Vitamin B12 deficiency anemia, unspecified: Secondary | ICD-10-CM | POA: Diagnosis not present

## 2023-01-21 DIAGNOSIS — I130 Hypertensive heart and chronic kidney disease with heart failure and stage 1 through stage 4 chronic kidney disease, or unspecified chronic kidney disease: Secondary | ICD-10-CM | POA: Diagnosis not present

## 2023-01-21 DIAGNOSIS — M109 Gout, unspecified: Secondary | ICD-10-CM | POA: Diagnosis not present

## 2023-01-21 DIAGNOSIS — I5032 Chronic diastolic (congestive) heart failure: Secondary | ICD-10-CM | POA: Diagnosis not present

## 2023-01-21 DIAGNOSIS — D638 Anemia in other chronic diseases classified elsewhere: Secondary | ICD-10-CM | POA: Diagnosis not present

## 2023-01-21 DIAGNOSIS — G2581 Restless legs syndrome: Secondary | ICD-10-CM | POA: Diagnosis not present

## 2023-01-21 DIAGNOSIS — N184 Chronic kidney disease, stage 4 (severe): Secondary | ICD-10-CM | POA: Diagnosis not present

## 2023-01-23 DIAGNOSIS — J449 Chronic obstructive pulmonary disease, unspecified: Secondary | ICD-10-CM | POA: Diagnosis not present

## 2023-01-25 ENCOUNTER — Ambulatory Visit: Payer: Medicare Other | Admitting: Cardiology

## 2023-01-26 DIAGNOSIS — D519 Vitamin B12 deficiency anemia, unspecified: Secondary | ICD-10-CM | POA: Diagnosis not present

## 2023-02-02 DIAGNOSIS — D519 Vitamin B12 deficiency anemia, unspecified: Secondary | ICD-10-CM | POA: Diagnosis not present

## 2023-02-03 ENCOUNTER — Other Ambulatory Visit: Payer: Self-pay | Admitting: Cardiology

## 2023-02-03 DIAGNOSIS — S52502D Unspecified fracture of the lower end of left radius, subsequent encounter for closed fracture with routine healing: Secondary | ICD-10-CM | POA: Diagnosis not present

## 2023-02-03 DIAGNOSIS — I4811 Longstanding persistent atrial fibrillation: Secondary | ICD-10-CM

## 2023-02-03 DIAGNOSIS — Z7901 Long term (current) use of anticoagulants: Secondary | ICD-10-CM

## 2023-02-10 DIAGNOSIS — D519 Vitamin B12 deficiency anemia, unspecified: Secondary | ICD-10-CM | POA: Diagnosis not present

## 2023-02-17 DIAGNOSIS — D519 Vitamin B12 deficiency anemia, unspecified: Secondary | ICD-10-CM | POA: Diagnosis not present

## 2023-03-22 DIAGNOSIS — S52502D Unspecified fracture of the lower end of left radius, subsequent encounter for closed fracture with routine healing: Secondary | ICD-10-CM | POA: Diagnosis not present

## 2023-03-23 DIAGNOSIS — D519 Vitamin B12 deficiency anemia, unspecified: Secondary | ICD-10-CM | POA: Diagnosis not present

## 2023-03-25 DIAGNOSIS — J449 Chronic obstructive pulmonary disease, unspecified: Secondary | ICD-10-CM | POA: Diagnosis not present

## 2023-04-01 DIAGNOSIS — Z1231 Encounter for screening mammogram for malignant neoplasm of breast: Secondary | ICD-10-CM | POA: Diagnosis not present

## 2023-04-11 DIAGNOSIS — Z170 Estrogen receptor positive status [ER+]: Secondary | ICD-10-CM | POA: Diagnosis not present

## 2023-04-11 DIAGNOSIS — C50412 Malignant neoplasm of upper-outer quadrant of left female breast: Secondary | ICD-10-CM | POA: Diagnosis not present

## 2023-04-25 DIAGNOSIS — J449 Chronic obstructive pulmonary disease, unspecified: Secondary | ICD-10-CM | POA: Diagnosis not present

## 2023-04-25 DIAGNOSIS — D519 Vitamin B12 deficiency anemia, unspecified: Secondary | ICD-10-CM | POA: Diagnosis not present

## 2023-04-25 DIAGNOSIS — D638 Anemia in other chronic diseases classified elsewhere: Secondary | ICD-10-CM | POA: Diagnosis not present

## 2023-04-25 DIAGNOSIS — I130 Hypertensive heart and chronic kidney disease with heart failure and stage 1 through stage 4 chronic kidney disease, or unspecified chronic kidney disease: Secondary | ICD-10-CM | POA: Diagnosis not present

## 2023-04-25 DIAGNOSIS — I6523 Occlusion and stenosis of bilateral carotid arteries: Secondary | ICD-10-CM | POA: Diagnosis not present

## 2023-04-25 DIAGNOSIS — I5032 Chronic diastolic (congestive) heart failure: Secondary | ICD-10-CM | POA: Diagnosis not present

## 2023-04-25 DIAGNOSIS — I482 Chronic atrial fibrillation, unspecified: Secondary | ICD-10-CM | POA: Diagnosis not present

## 2023-04-25 DIAGNOSIS — G2581 Restless legs syndrome: Secondary | ICD-10-CM | POA: Diagnosis not present

## 2023-04-25 DIAGNOSIS — M109 Gout, unspecified: Secondary | ICD-10-CM | POA: Diagnosis not present

## 2023-04-25 DIAGNOSIS — N184 Chronic kidney disease, stage 4 (severe): Secondary | ICD-10-CM | POA: Diagnosis not present

## 2023-04-25 DIAGNOSIS — E039 Hypothyroidism, unspecified: Secondary | ICD-10-CM | POA: Diagnosis not present

## 2023-04-25 DIAGNOSIS — E785 Hyperlipidemia, unspecified: Secondary | ICD-10-CM | POA: Diagnosis not present

## 2023-04-25 DIAGNOSIS — I4891 Unspecified atrial fibrillation: Secondary | ICD-10-CM | POA: Diagnosis not present

## 2023-04-26 DIAGNOSIS — M79602 Pain in left arm: Secondary | ICD-10-CM | POA: Diagnosis not present

## 2023-04-26 DIAGNOSIS — M25532 Pain in left wrist: Secondary | ICD-10-CM | POA: Diagnosis not present

## 2023-05-02 DIAGNOSIS — M25532 Pain in left wrist: Secondary | ICD-10-CM | POA: Diagnosis not present

## 2023-05-02 DIAGNOSIS — S52502P Unspecified fracture of the lower end of left radius, subsequent encounter for closed fracture with malunion: Secondary | ICD-10-CM | POA: Diagnosis not present

## 2023-05-02 DIAGNOSIS — G5602 Carpal tunnel syndrome, left upper limb: Secondary | ICD-10-CM | POA: Diagnosis not present

## 2023-05-04 DIAGNOSIS — D519 Vitamin B12 deficiency anemia, unspecified: Secondary | ICD-10-CM | POA: Diagnosis not present

## 2023-05-25 DIAGNOSIS — J449 Chronic obstructive pulmonary disease, unspecified: Secondary | ICD-10-CM | POA: Diagnosis not present

## 2023-06-01 ENCOUNTER — Other Ambulatory Visit: Payer: Self-pay | Admitting: Cardiology

## 2023-06-01 DIAGNOSIS — I4811 Longstanding persistent atrial fibrillation: Secondary | ICD-10-CM

## 2023-06-01 DIAGNOSIS — Z7901 Long term (current) use of anticoagulants: Secondary | ICD-10-CM

## 2023-06-02 NOTE — Telephone Encounter (Signed)
Prescription refill request for Eliquis received. Indication: AF Last office visit: 11/23/22  Caryl Pina MD Scr: 1.52 on 04/25/23  [facility] Age: 87 Weight: 73.5kg  Based on above findings Eliquis 2.5mg  twice daily is the appropriate dose.  Refill approved.

## 2023-06-08 DIAGNOSIS — D519 Vitamin B12 deficiency anemia, unspecified: Secondary | ICD-10-CM | POA: Diagnosis not present

## 2023-06-16 DIAGNOSIS — S60221A Contusion of right hand, initial encounter: Secondary | ICD-10-CM | POA: Diagnosis not present

## 2023-06-25 DIAGNOSIS — J449 Chronic obstructive pulmonary disease, unspecified: Secondary | ICD-10-CM | POA: Diagnosis not present

## 2023-06-29 DIAGNOSIS — J208 Acute bronchitis due to other specified organisms: Secondary | ICD-10-CM | POA: Diagnosis not present

## 2023-06-29 DIAGNOSIS — B9689 Other specified bacterial agents as the cause of diseases classified elsewhere: Secondary | ICD-10-CM | POA: Diagnosis not present

## 2023-07-04 DIAGNOSIS — S60221A Contusion of right hand, initial encounter: Secondary | ICD-10-CM | POA: Diagnosis not present

## 2023-07-04 DIAGNOSIS — G2581 Restless legs syndrome: Secondary | ICD-10-CM | POA: Diagnosis not present

## 2023-07-04 DIAGNOSIS — E785 Hyperlipidemia, unspecified: Secondary | ICD-10-CM | POA: Diagnosis not present

## 2023-07-04 DIAGNOSIS — E039 Hypothyroidism, unspecified: Secondary | ICD-10-CM | POA: Diagnosis not present

## 2023-07-04 DIAGNOSIS — M25531 Pain in right wrist: Secondary | ICD-10-CM | POA: Diagnosis not present

## 2023-07-04 DIAGNOSIS — D638 Anemia in other chronic diseases classified elsewhere: Secondary | ICD-10-CM | POA: Diagnosis not present

## 2023-07-04 DIAGNOSIS — I130 Hypertensive heart and chronic kidney disease with heart failure and stage 1 through stage 4 chronic kidney disease, or unspecified chronic kidney disease: Secondary | ICD-10-CM | POA: Diagnosis not present

## 2023-07-04 DIAGNOSIS — N184 Chronic kidney disease, stage 4 (severe): Secondary | ICD-10-CM | POA: Diagnosis not present

## 2023-07-04 DIAGNOSIS — I5032 Chronic diastolic (congestive) heart failure: Secondary | ICD-10-CM | POA: Diagnosis not present

## 2023-07-04 DIAGNOSIS — I482 Chronic atrial fibrillation, unspecified: Secondary | ICD-10-CM | POA: Diagnosis not present

## 2023-07-04 DIAGNOSIS — D519 Vitamin B12 deficiency anemia, unspecified: Secondary | ICD-10-CM | POA: Diagnosis not present

## 2023-07-07 DIAGNOSIS — D519 Vitamin B12 deficiency anemia, unspecified: Secondary | ICD-10-CM | POA: Diagnosis not present

## 2023-07-07 DIAGNOSIS — Z23 Encounter for immunization: Secondary | ICD-10-CM | POA: Diagnosis not present

## 2023-07-25 DIAGNOSIS — J449 Chronic obstructive pulmonary disease, unspecified: Secondary | ICD-10-CM | POA: Diagnosis not present

## 2023-08-08 DIAGNOSIS — M79641 Pain in right hand: Secondary | ICD-10-CM | POA: Diagnosis not present

## 2023-08-08 DIAGNOSIS — G5602 Carpal tunnel syndrome, left upper limb: Secondary | ICD-10-CM | POA: Diagnosis not present

## 2023-08-08 DIAGNOSIS — S62611A Displaced fracture of proximal phalanx of left index finger, initial encounter for closed fracture: Secondary | ICD-10-CM | POA: Diagnosis not present

## 2023-08-11 DIAGNOSIS — D519 Vitamin B12 deficiency anemia, unspecified: Secondary | ICD-10-CM | POA: Diagnosis not present

## 2023-08-25 DIAGNOSIS — J449 Chronic obstructive pulmonary disease, unspecified: Secondary | ICD-10-CM | POA: Diagnosis not present

## 2023-09-23 DIAGNOSIS — J449 Chronic obstructive pulmonary disease, unspecified: Secondary | ICD-10-CM | POA: Diagnosis not present

## 2023-10-14 DIAGNOSIS — E039 Hypothyroidism, unspecified: Secondary | ICD-10-CM | POA: Diagnosis not present

## 2023-10-14 DIAGNOSIS — I5032 Chronic diastolic (congestive) heart failure: Secondary | ICD-10-CM | POA: Diagnosis not present

## 2023-10-14 DIAGNOSIS — J449 Chronic obstructive pulmonary disease, unspecified: Secondary | ICD-10-CM | POA: Diagnosis not present

## 2023-10-14 DIAGNOSIS — D519 Vitamin B12 deficiency anemia, unspecified: Secondary | ICD-10-CM | POA: Diagnosis not present

## 2023-10-14 DIAGNOSIS — N184 Chronic kidney disease, stage 4 (severe): Secondary | ICD-10-CM | POA: Diagnosis not present

## 2023-10-14 DIAGNOSIS — I130 Hypertensive heart and chronic kidney disease with heart failure and stage 1 through stage 4 chronic kidney disease, or unspecified chronic kidney disease: Secondary | ICD-10-CM | POA: Diagnosis not present

## 2023-10-14 DIAGNOSIS — I4891 Unspecified atrial fibrillation: Secondary | ICD-10-CM | POA: Diagnosis not present

## 2023-10-14 DIAGNOSIS — G2581 Restless legs syndrome: Secondary | ICD-10-CM | POA: Diagnosis not present

## 2023-10-14 DIAGNOSIS — D638 Anemia in other chronic diseases classified elsewhere: Secondary | ICD-10-CM | POA: Diagnosis not present

## 2023-10-14 DIAGNOSIS — M109 Gout, unspecified: Secondary | ICD-10-CM | POA: Diagnosis not present

## 2023-10-14 DIAGNOSIS — E785 Hyperlipidemia, unspecified: Secondary | ICD-10-CM | POA: Diagnosis not present

## 2023-10-23 DIAGNOSIS — J449 Chronic obstructive pulmonary disease, unspecified: Secondary | ICD-10-CM | POA: Diagnosis not present

## 2023-11-16 DIAGNOSIS — D519 Vitamin B12 deficiency anemia, unspecified: Secondary | ICD-10-CM | POA: Diagnosis not present

## 2023-11-23 DIAGNOSIS — J449 Chronic obstructive pulmonary disease, unspecified: Secondary | ICD-10-CM | POA: Diagnosis not present

## 2023-11-28 DIAGNOSIS — G5602 Carpal tunnel syndrome, left upper limb: Secondary | ICD-10-CM | POA: Diagnosis not present

## 2023-11-28 DIAGNOSIS — S62612A Displaced fracture of proximal phalanx of right middle finger, initial encounter for closed fracture: Secondary | ICD-10-CM | POA: Diagnosis not present

## 2023-12-07 ENCOUNTER — Other Ambulatory Visit: Payer: Self-pay | Admitting: Cardiology

## 2023-12-07 DIAGNOSIS — Z7901 Long term (current) use of anticoagulants: Secondary | ICD-10-CM

## 2023-12-07 DIAGNOSIS — I4811 Longstanding persistent atrial fibrillation: Secondary | ICD-10-CM

## 2023-12-07 NOTE — Telephone Encounter (Addendum)
 Eliquis  2.5mg  refill request received. Patient is 88 years old, weight-73.5kg, Crea-1.75 on 10/15/23 via [facility] from [facility], Diagnosis-Afib, and last seen by Dr. Sandee Crook on 11/23/22. Dose is appropriate based on dosing criteria.   Message sent to Schedulers for an appointment.

## 2023-12-20 DIAGNOSIS — S51802A Unspecified open wound of left forearm, initial encounter: Secondary | ICD-10-CM | POA: Diagnosis not present

## 2023-12-20 DIAGNOSIS — D519 Vitamin B12 deficiency anemia, unspecified: Secondary | ICD-10-CM | POA: Diagnosis not present

## 2023-12-20 DIAGNOSIS — T148XXA Other injury of unspecified body region, initial encounter: Secondary | ICD-10-CM | POA: Diagnosis not present

## 2023-12-23 DIAGNOSIS — J449 Chronic obstructive pulmonary disease, unspecified: Secondary | ICD-10-CM | POA: Diagnosis not present

## 2023-12-30 DIAGNOSIS — L089 Local infection of the skin and subcutaneous tissue, unspecified: Secondary | ICD-10-CM | POA: Diagnosis not present

## 2023-12-30 DIAGNOSIS — S50812A Abrasion of left forearm, initial encounter: Secondary | ICD-10-CM | POA: Diagnosis not present

## 2024-01-23 DIAGNOSIS — J449 Chronic obstructive pulmonary disease, unspecified: Secondary | ICD-10-CM | POA: Diagnosis not present

## 2024-01-24 ENCOUNTER — Ambulatory Visit: Admitting: Cardiology

## 2024-01-25 DIAGNOSIS — D638 Anemia in other chronic diseases classified elsewhere: Secondary | ICD-10-CM | POA: Diagnosis not present

## 2024-01-25 DIAGNOSIS — E039 Hypothyroidism, unspecified: Secondary | ICD-10-CM | POA: Diagnosis not present

## 2024-01-25 DIAGNOSIS — R600 Localized edema: Secondary | ICD-10-CM | POA: Diagnosis not present

## 2024-01-25 DIAGNOSIS — I5032 Chronic diastolic (congestive) heart failure: Secondary | ICD-10-CM | POA: Diagnosis not present

## 2024-01-25 DIAGNOSIS — I4891 Unspecified atrial fibrillation: Secondary | ICD-10-CM | POA: Diagnosis not present

## 2024-01-25 DIAGNOSIS — E785 Hyperlipidemia, unspecified: Secondary | ICD-10-CM | POA: Diagnosis not present

## 2024-01-25 DIAGNOSIS — I482 Chronic atrial fibrillation, unspecified: Secondary | ICD-10-CM | POA: Diagnosis not present

## 2024-01-25 DIAGNOSIS — I130 Hypertensive heart and chronic kidney disease with heart failure and stage 1 through stage 4 chronic kidney disease, or unspecified chronic kidney disease: Secondary | ICD-10-CM | POA: Diagnosis not present

## 2024-01-25 DIAGNOSIS — D519 Vitamin B12 deficiency anemia, unspecified: Secondary | ICD-10-CM | POA: Diagnosis not present

## 2024-01-25 DIAGNOSIS — I6523 Occlusion and stenosis of bilateral carotid arteries: Secondary | ICD-10-CM | POA: Diagnosis not present

## 2024-02-12 NOTE — Progress Notes (Unsigned)
 Cardiology Office Note   Date:  02/14/2024  ID:  Melanie Montes, DOB 05-25-1936, MRN [id number] PCP: Keren Vicenta BRAVO, MD  [facility] Providers Cardiologist:  Redell Leiter, MD Cardiology APP:  Carlin Delon BROCKS, NP     History of Present Illness Melanie Montes is a 88 y.o. female with a past medical history of hypertension, HFpEF, carotid artery stenosis, CKD, paroxysmal atrial fibrillation on Eliquis , COPD, hypothyroidism, history of breast cancer, RBBB.  08/08/2022 echo EF 55 to 60%, mild MR, mild TR, PASP moderately elevated 10/08/2020 monitor A-fib burden 100%, no pauses 03/04/2020 monitor A-fib burden 3% 09/26/2019 Lexiscan  normal, low risk 07/04/2018 carotid duplex moderate bilateral carotid artery stenosis  She is a longstanding patient of Dr. Leiter for the evaluation management of heart failure as well as atrial fibrillation.  She had previously been on amiodarone  however her atrial fibrillation persisted so a rate control strategy was pursued in 2022.  Most recently she was evaluated by Dr. Leiter on 11/23/2022, she was stable from a cardiac perspective no changes were made to her medications or plan of care and she was advised to follow-up in 6 months.  She presents today accompanied by her daughter for follow-up.  She has been doing stable since she was last evaluated in our office, no formal complaints from a cardiac perspective.  She lives by herself, but has several family members that check on her daily.  Unfortunately, she has had a few falls, not related to orthostasis or syncope, rather she just feels like she is walking too fast and cannot keep up.  She has some shortness of breath but she feels this is baseline for her and related to her COPD.  She is tolerating her Eliquis  without any adverse side effects. She denies chest pain, palpitations, dyspnea, pnd, orthopnea, n, v, dizziness, syncope, edema, weight gain, or early satiety.   ROS: Review of Systems   Musculoskeletal:  Positive for back pain and falls.  Neurological:  Negative for dizziness and loss of consciousness.  All other systems reviewed and are negative.    Studies Reviewed EKG Interpretation Date/Time:  Tuesday February 14 2024 15:52:43 EDT Ventricular Rate:  89 PR Interval:    QRS Duration:  144 QT Interval:  414 QTC Calculation: 503 R Axis:   109  Text Interpretation: Undetermined rhythm Right bundle branch block When compared with ECG of 04-Aug-2018 09:43, Confirmed by Carlin Delon [phone]) on 02/14/2024 3:55:44 PM    Cardiac Studies & Procedures   ______________________________________________________________________________________________   STRESS TESTS  MYOCARDIAL PERFUSION IMAGING 09/26/2019  Interpretation Summary  Nuclear stress EF: 67%.  The left ventricular ejection fraction is hyperdynamic (>65%).  There was no ST segment deviation noted during stress.  The study is normal.  This is a low risk study.  Defect 1: There is a medium defect of moderate severity present in the basal inferior and mid inferior location. Most likely represents soft tissue attenuation.   ECHOCARDIOGRAM  ECHOCARDIOGRAM COMPLETE 02/28/2018  Narrative *[facility]* [address] [place], [place] [address] [phone]  ------------------------------------------------------------------- Transthoracic Echocardiography  Patient:    Melanie, Montes MR #:       [id number] Study Date: 02/28/2018 Gender:     F Age:        82 Height:     162.6 cm Weight:     90.6 kg BSA:        2.06 m^2 Pt. Status: Room:  ATTENDING    Redell Leiter,  MD ORDERING     Redell Leiter, MD REFERRING    Redell Leiter, MD SONOGRAPHER  Annabella Fell, RDCS PERFORMING   [facility] [place]  cc:  ------------------------------------------------------------------- LV EF: 55% -   60%  ------------------------------------------------------------------- Indications:      CHF -  428.0.  ------------------------------------------------------------------- History:   PMH:  Chronic Kidney Disease.  Atrial fibrillation. PMH:  Breast Cancer.  Risk factors:  Former tobacco use. Hypertension.  ------------------------------------------------------------------- Study Conclusions  - Left ventricle: The cavity size was normal. Systolic function was normal. The estimated ejection fraction was in the range of 55% to 60%. Wall motion was normal; there were no regional wall motion abnormalities. - Aortic valve: There was trivial regurgitation.  Impressions:  - Normal LVEF. Normal LA size Mild MR. Trace AR  Note: pt was in atrial fibrillation during the study with fast ventricular rate.  ------------------------------------------------------------------- Study data:  No prior study was available for comparison.  Study status:  Routine.  Procedure:  The patient reported no pain pre or post test. Transthoracic echocardiography. Image quality was adequate.  Study completion:  There were no complications. Transthoracic echocardiography.  M-mode, complete 2D, spectral Doppler, and color Doppler.  Birthdate:  Patient birthdate: Feb 13, 1936.  Age:  Patient is 88 yr old.  Sex:  Gender: female. BMI: 34.3 kg/m^2.  Blood pressure:     126/78  Patient status: Outpatient.  Study date:  Study date: 02/28/2018. Study time: 10:16 AM.  Location:  Echo laboratory.  -------------------------------------------------------------------  ------------------------------------------------------------------- Left ventricle:  The cavity size was normal. Systolic function was normal. The estimated ejection fraction was in the range of 55% to 60%. Wall motion was normal; there were no regional wall motion abnormalities.  ------------------------------------------------------------------- Aortic valve:   Trileaflet; normal thickness leaflets. Mobility was not restricted.  Doppler:   Transvalvular velocity was within the normal range. There was no stenosis. There was trivial regurgitation.  ------------------------------------------------------------------- Aorta:  Aortic root: The aortic root was normal in size.  ------------------------------------------------------------------- Mitral valve:   Structurally normal valve.   Mobility was not restricted.  Doppler:  Transvalvular velocity was within the normal range. There was no evidence for stenosis. There was no regurgitation.    Valve area by pressure half-time: 4.15 cm^2. Indexed valve area by pressure half-time: 2.01 cm^2/m^2.    Peak gradient (D): 3 mm Hg.  ------------------------------------------------------------------- Left atrium:  The atrium was normal in size.  ------------------------------------------------------------------- Right ventricle:  The cavity size was normal. Wall thickness was normal. Systolic function was normal.  ------------------------------------------------------------------- Pulmonic valve:    Structurally normal valve.   Cusp separation was normal.  Doppler:  Transvalvular velocity was within the normal range. There was no evidence for stenosis. There was no regurgitation.  ------------------------------------------------------------------- Tricuspid valve:   Structurally normal valve.    Doppler: Transvalvular velocity was within the normal range. There was mild regurgitation.  ------------------------------------------------------------------- Pulmonary artery:   The main pulmonary artery was normal-sized. Systolic pressure was within the normal range.  ------------------------------------------------------------------- Right atrium:  The atrium was normal in size.  ------------------------------------------------------------------- Pericardium:  There was no pericardial effusion.  ------------------------------------------------------------------- Systemic  veins: Inferior vena cava: The vessel was normal in size.  ------------------------------------------------------------------- Measurements  Left ventricle                           Value          Reference LV ID, ED, PLAX chordal          (L)  35    mm       43 - 52 LV ID, ES, PLAX chordal                  24    mm       23 - 38 LV fx shortening, PLAX chordal           31    %        >=29 LV PW thickness, ED                      11    mm       ---------- IVS/LV PW ratio, ED                      1.18           <=1.3 Stroke volume, 2D                        27    ml       ---------- Stroke volume/bsa, 2D                    13    ml/m^2   ---------- LV end-diastolic volume, 1-p A4C         37    ml       ---------- LV ejection fraction, 1-p A4C            51    %        ---------- LV end-diastolic volume/bsa, 1-p         18    ml/m^2   ---------- A4C LV end-diastolic volume, 2-p             57    ml       ---------- LV end-systolic volume, 2-p              30    ml       ---------- LV ejection fraction, 2-p                47    %        ---------- Stroke volume, 2-p                       26    ml       ---------- LV end-diastolic volume/bsa, 2-p         27    ml/m^2   ---------- LV end-systolic volume/bsa, 2-p          15    ml/m^2   ---------- Stroke volume/bsa, 2-p                   12.8  ml/m^2   ---------- LV e&', lateral                           10.9  cm/s     ---------- LV E/e&', lateral                         7.92           ---------- LV e&', medial                            9.03  cm/s     ----------  LV E/e&', medial                          9.56           ---------- LV e&', average                           9.97  cm/s     ---------- LV E/e&', average                         8.66           ----------  Ventricular septum                       Value          Reference IVS thickness, ED                        13    mm       ----------  LVOT                                      Value          Reference LVOT ID, S                               17    mm       ---------- LVOT area                                2.27  cm^2     ---------- LVOT peak velocity, S                    67.4  cm/s     ---------- LVOT mean velocity, S                    43.1  cm/s     ---------- LVOT VTI, S                              11.7  cm       ---------- LVOT peak gradient, S                    2     mm Hg    ----------  Aortic valve                             Value          Reference Aortic regurg pressure half-time         605   ms       ----------  Aorta                                    Value          Reference Aortic root ID, ED  27    mm       ---------- Ascending aorta ID, A-P, S               29    mm       ----------  Left atrium                              Value          Reference LA ID, A-P, ES                           32    mm       ---------- LA ID/bsa, A-P                           1.55  cm/m^2   <=2.2 LA volume, S                             44.3  ml       ---------- LA volume/bsa, S                         21.5  ml/m^2   ---------- LA volume, ES, 1-p A4C                   40.1  ml       ---------- LA volume/bsa, ES, 1-p A4C               19.5  ml/m^2   ---------- LA volume, ES, 1-p A2C                   47.5  ml       ---------- LA volume/bsa, ES, 1-p A2C               23    ml/m^2   ----------  Mitral valve                             Value          Reference Mitral E-wave peak velocity              86.3  cm/s     ---------- Mitral deceleration time                 158   ms       150 - 230 Mitral pressure half-time                46    ms       ---------- Mitral peak gradient, D                  3     mm Hg    ---------- Mitral valve area, PHT, DP               4.15  cm^2     ---------- Mitral valve area/bsa, PHT, DP           2.01  cm^2/m^2 ----------  Pulmonary arteries                       Value          Reference PA pressure,  S, DP                        27    mm Hg    <=30  Tricuspid valve                          Value          Reference Tricuspid regurg peak velocity           246   cm/s     ---------- Tricuspid peak RV-RA gradient            24    mm Hg    ----------  Right atrium                             Value          Reference RA ID, S-I, ES, A4C              (H)     49.1  mm       34 - 49 RA area, ES, A4C                         13.5  cm^2     8.3 - 19.5 RA volume, ES, A/L                       30.6  ml       ---------- RA volume/bsa, ES, A/L                   14.8  ml/m^2   ----------  Systemic veins                           Value          Reference Estimated CVP                            3     mm Hg    ----------  Right ventricle                          Value          Reference TAPSE                                    14.6  mm       ---------- RV pressure, S, DP                       27    mm Hg    <=30 RV s&', lateral, S                        9.36  cm/s     ----------  Legend: (L)  and  (H)  mark values outside specified reference range.  ------------------------------------------------------------------- Prepared and Electronically Authenticated by  Lamar Fitch, MD 2019-08-06T17:36:25    MONITORS  LONG TERM MONITOR (3-14 DAYS) 10/29/2020  Narrative Patch Wear Time:  3 days and 0 hours (2022-03-28T08:15:28-0400 to 2022-03-31T09:09:54-0400)  Atrial Fibrillation occurred continuously (100% burden), ranging from 62-144 bpm (avg  of 96 bpm). Isolated VEs were rare (<1.0%), and no VE Couplets or VE Triplets were present.  The indication was persistent atrial fibrillation. There are no pauses of 3 seconds or greater. There were no triggered and diary events. Ventricular ectopy was rare with rare less than 1% occasional PVCs. The average heart rate in atrial fibrillation 96 bpm heart rate in range daytime 50-110 88% nighttime 97%.  Daytime 12% with rate 110s to 150 bpm.   Conclusion  atrial fibrillation with good heart rate control.       ______________________________________________________________________________________________      Risk Assessment/Calculations  CHA2DS2-VASc Score = 6   This indicates a 9.7% annual risk of stroke. The patient's score is based upon: CHF History: 1 HTN History: 1 Diabetes History: 0 Stroke History: 0 Vascular Disease History: 1 Age Score: 2 Gender Score: 1            Physical Exam VS:  BP 120/70   Pulse 89   Ht 5' 4 (1.626 m)   Wt 176 lb (79.8 kg)   SpO2 90%   BMI 30.21 kg/m        Wt Readings from Last 3 Encounters:  02/14/24 176 lb (79.8 kg)  11/23/22 162 lb (73.5 kg)  04/06/21 171 lb 12.8 oz (77.9 kg)    GEN: Well nourished, well developed in no acute distress NECK: No JVD; right carotid bruit CARDIAC: RRR, no murmurs, rubs, gallops RESPIRATORY:  Clear to auscultation without rales, wheezing or rhonchi  ABDOMEN: Soft, non-tender, non-distended EXTREMITIES:  No edema; No deformity   ASSESSMENT AND PLAN Atrial fibrillation/hypercoagulable state-CHA2DS2-VASc score is 6, lab work completed by PCP earlier this month revealed Hemoglobin 12.6, hematocrit 39.5, creatinine 1.52 discharge (prior cr 1.75), will decrease her dose of Eliquis  to 2.5 mg twice daily.  HFpEF-NYHA class II, euvolemic.  Carotid artery stenosis-bruit noted over right carotid artery, will arrange for repeat duplex although she states she would not be interested in any type of surgical intervention she is curious if her stenosis is worsened.  She is currently on Eliquis , intolerant of statins.  Dyslipidemia-most recent LDL was slightly elevated at 86 earlier this month however she is intolerant of statins; continue Zetia 10 mg daily and WelChol.       Dispo: Carotid duplex, decrease Eliquis  to 2.5 mg twice daily, follow-up in 6 months.  Signed, Delon JAYSON Hoover, NP

## 2024-02-14 ENCOUNTER — Encounter: Payer: Self-pay | Admitting: Cardiology

## 2024-02-14 ENCOUNTER — Ambulatory Visit: Attending: Cardiology | Admitting: Cardiology

## 2024-02-14 ENCOUNTER — Telehealth: Payer: Self-pay | Admitting: Cardiology

## 2024-02-14 VITALS — BP 120/70 | HR 89 | Ht 64.0 in | Wt 176.0 lb

## 2024-02-14 DIAGNOSIS — I4811 Longstanding persistent atrial fibrillation: Secondary | ICD-10-CM

## 2024-02-14 DIAGNOSIS — I4821 Permanent atrial fibrillation: Secondary | ICD-10-CM

## 2024-02-14 DIAGNOSIS — I130 Hypertensive heart and chronic kidney disease with heart failure and stage 1 through stage 4 chronic kidney disease, or unspecified chronic kidney disease: Secondary | ICD-10-CM

## 2024-02-14 DIAGNOSIS — I6523 Occlusion and stenosis of bilateral carotid arteries: Secondary | ICD-10-CM

## 2024-02-14 DIAGNOSIS — Z7901 Long term (current) use of anticoagulants: Secondary | ICD-10-CM

## 2024-02-14 DIAGNOSIS — E782 Mixed hyperlipidemia: Secondary | ICD-10-CM | POA: Diagnosis not present

## 2024-02-14 NOTE — Telephone Encounter (Signed)
 Please let her know I had a chance to review Dr. Keren lab work, and we need to reduce her dose of Eliquis .  Her creatinine is persistently elevated and she is > 80, so she meets criteria to reduce her dose of Eliquis  to 2.5 mg twice daily.

## 2024-02-14 NOTE — Patient Instructions (Signed)
 Medication Instructions:   No changes   *If you need a refill on your cardiac medications before your next appointment, please call your pharmacy*  Lab Work:  None today   If you have labs (blood work) drawn today and your tests are completely normal, you will receive your results only by: MyChart Message (if you have MyChart) OR A paper copy in the mail If you have any lab test that is abnormal or we need to change your treatment, we will call you to review the results.  Testing/Procedures:  Your physician has requested that you have a carotid duplex. This test is an ultrasound of the carotid arteries in your neck. It looks at blood flow through these arteries that supply the brain with blood. Allow one hour for this exam. There are no restrictions or special instructions.   Follow-Up: At [facility], you and your health needs are our priority.  As part of our continuing mission to provide you with exceptional heart care, our providers are all part of one team.  This team includes your primary Cardiologist (physician) and Advanced Practice Providers or APPs (Physician Assistants and Nurse Practitioners) who all work together to provide you with the care you need, when you need it.  Your next appointment:   6 month(s)  Provider:   Redell Leiter, MD    We recommend signing up for the patient portal called MyChart.  Sign up information is provided on this After Visit Summary.  MyChart is used to connect with patients for Virtual Visits (Telemedicine).  Patients are able to view lab/test results, encounter notes, upcoming appointments, etc.  Non-urgent messages can be sent to your provider as well.   To learn more about what you can do with MyChart, go to [email or web address].   Other Instructions   ** call your insurance and see if they will cover a carotid duplex for carotid artery stenosis **

## 2024-02-15 ENCOUNTER — Encounter: Payer: Self-pay | Admitting: Internal Medicine

## 2024-02-15 MED ORDER — APIXABAN 2.5 MG PO TABS: 2.5000 mg | ORAL_TABLET | Freq: Two times a day (BID) | ORAL | 5 refills | Status: DC

## 2024-02-15 NOTE — Telephone Encounter (Signed)
 Spoke with pt regarding Melanie Montes note regarding Melanie Montes  dose change. Pt verbalized understanding and had no further questions. Melanie Montes  2.5mg  1 Bid sent to [facility] pharmacy

## 2024-02-15 NOTE — Addendum Note (Signed)
 Addended by: ARLOA PLANAS D on: 02/15/2024 08:52 AM   Modules accepted: Orders

## 2024-02-15 NOTE — Telephone Encounter (Signed)
 LVM to call regarding Melanie Montes message.

## 2024-02-22 DIAGNOSIS — J449 Chronic obstructive pulmonary disease, unspecified: Secondary | ICD-10-CM | POA: Diagnosis not present

## 2024-02-29 DIAGNOSIS — D519 Vitamin B12 deficiency anemia, unspecified: Secondary | ICD-10-CM | POA: Diagnosis not present

## 2024-03-12 ENCOUNTER — Ambulatory Visit: Attending: Cardiology

## 2024-03-12 DIAGNOSIS — I6523 Occlusion and stenosis of bilateral carotid arteries: Secondary | ICD-10-CM

## 2024-03-13 ENCOUNTER — Ambulatory Visit: Payer: Self-pay | Admitting: Cardiology

## 2024-03-14 DIAGNOSIS — S81802A Unspecified open wound, left lower leg, initial encounter: Secondary | ICD-10-CM | POA: Diagnosis not present

## 2024-03-21 DIAGNOSIS — R1013 Epigastric pain: Secondary | ICD-10-CM | POA: Diagnosis not present

## 2024-03-24 DIAGNOSIS — J449 Chronic obstructive pulmonary disease, unspecified: Secondary | ICD-10-CM | POA: Diagnosis not present

## 2024-04-02 DIAGNOSIS — Z1231 Encounter for screening mammogram for malignant neoplasm of breast: Secondary | ICD-10-CM | POA: Diagnosis not present

## 2024-04-04 DIAGNOSIS — D519 Vitamin B12 deficiency anemia, unspecified: Secondary | ICD-10-CM | POA: Diagnosis not present

## 2024-04-11 ENCOUNTER — Telehealth: Payer: Self-pay

## 2024-04-11 ENCOUNTER — Encounter: Payer: Self-pay | Admitting: Internal Medicine

## 2024-04-11 ENCOUNTER — Ambulatory Visit: Admitting: Internal Medicine

## 2024-04-11 ENCOUNTER — Ambulatory Visit: Payer: Self-pay | Admitting: Internal Medicine

## 2024-04-11 ENCOUNTER — Other Ambulatory Visit (INDEPENDENT_AMBULATORY_CARE_PROVIDER_SITE_OTHER)

## 2024-04-11 VITALS — BP 130/80 | HR 80 | Ht 64.0 in | Wt 172.0 lb

## 2024-04-11 DIAGNOSIS — Z860100 Personal history of colon polyps, unspecified: Secondary | ICD-10-CM

## 2024-04-11 DIAGNOSIS — Z800 Family history of malignant neoplasm of digestive organs: Secondary | ICD-10-CM

## 2024-04-11 DIAGNOSIS — R6881 Early satiety: Secondary | ICD-10-CM | POA: Diagnosis not present

## 2024-04-11 DIAGNOSIS — R1110 Vomiting, unspecified: Secondary | ICD-10-CM | POA: Diagnosis not present

## 2024-04-11 DIAGNOSIS — Z7901 Long term (current) use of anticoagulants: Secondary | ICD-10-CM | POA: Diagnosis not present

## 2024-04-11 DIAGNOSIS — Z860101 Personal history of adenomatous and serrated colon polyps: Secondary | ICD-10-CM | POA: Diagnosis not present

## 2024-04-11 LAB — CBC WITH DIFFERENTIAL/PLATELET
Basophils Absolute: 0.1 K/uL (ref 0.0–0.1)
Basophils Relative: 1.1 % (ref 0.0–3.0)
Eosinophils Absolute: 0.6 K/uL (ref 0.0–0.7)
Eosinophils Relative: 10.5 % — ABNORMAL HIGH (ref 0.0–5.0)
HCT: 38.8 % (ref 36.0–46.0)
Hemoglobin: 12.7 g/dL (ref 12.0–15.0)
Lymphocytes Relative: 25.3 % (ref 12.0–46.0)
Lymphs Abs: 1.5 K/uL (ref 0.7–4.0)
MCHC: 32.9 g/dL (ref 30.0–36.0)
MCV: 84.7 fl (ref 78.0–100.0)
Monocytes Absolute: 0.5 K/uL (ref 0.1–1.0)
Monocytes Relative: 9 % (ref 3.0–12.0)
Neutro Abs: 3.1 K/uL (ref 1.4–7.7)
Neutrophils Relative %: 54.1 % (ref 43.0–77.0)
Platelets: 208 K/uL (ref 150.0–400.0)
RBC: 4.58 Mil/uL (ref 3.87–5.11)
RDW: 14.9 % (ref 11.5–15.5)
WBC: 5.8 K/uL (ref 4.0–10.5)

## 2024-04-11 LAB — COMPREHENSIVE METABOLIC PANEL WITH GFR
ALT: 8 U/L (ref 0–35)
AST: 16 U/L (ref 0–37)
Albumin: 3.8 g/dL (ref 3.5–5.2)
Alkaline Phosphatase: 71 U/L (ref 39–117)
BUN: 15 mg/dL (ref 6–23)
CO2: 38 meq/L — ABNORMAL HIGH (ref 19–32)
Calcium: 9.3 mg/dL (ref 8.4–10.5)
Chloride: 95 meq/L — ABNORMAL LOW (ref 96–112)
Creatinine, Ser: 1.65 mg/dL — ABNORMAL HIGH (ref 0.40–1.20)
GFR: 28.04 mL/min — ABNORMAL LOW (ref >60.00)
Glucose, Bld: 85 mg/dL (ref 70–99)
Potassium: 3.7 meq/L (ref 3.5–5.1)
Sodium: 140 meq/L (ref 135–145)
Total Bilirubin: 1.1 mg/dL (ref 0.2–1.2)
Total Protein: 7 g/dL (ref 6.0–8.3)

## 2024-04-11 LAB — IBC + FERRITIN
Ferritin: 39.5 ng/mL (ref 10.0–291.0)
Iron: 82 ug/dL (ref 42–145)
Saturation Ratios: 20.1 % (ref 20.0–50.0)
TIBC: 408.8 ug/dL (ref 250.0–450.0)
Transferrin: 292 mg/dL (ref 212.0–360.0)

## 2024-04-11 MED ORDER — SUTAB 1479-225-188 MG PO TABS
24.0000 | ORAL_TABLET | Freq: Once | ORAL | 0 refills | Status: AC
Start: 2024-04-11 — End: 2024-04-11

## 2024-04-11 MED ORDER — NA SULFATE-K SULFATE-MG SULF 17.5-3.13-1.6 GM/177ML PO SOLN: 1.0000 | Freq: Once | ORAL | 0 refills | Status: AC

## 2024-04-11 NOTE — Patient Instructions (Signed)
 Your provider has requested that you go to the basement level for lab work before leaving today. Press B on the elevator. The lab is located at the first door on the left as you exit the elevator.  You have been scheduled for an endoscopy and colonoscopy. Please follow the written instructions given to you at your visit today.  If you use inhalers (even only as needed), please bring them with you on the day of your procedure.  DO NOT TAKE 7 DAYS PRIOR TO TEST- Trulicity (dulaglutide) Ozempic, Wegovy (semaglutide) Mounjaro (tirzepatide) Bydureon Bcise (exanatide extended release)  DO NOT TAKE 1 DAY PRIOR TO YOUR TEST Rybelsus (semaglutide) Adlyxin (lixisenatide) Victoza (liraglutide) Byetta (exanatide) ___________________________________________________________________________ _______________________________________________________  If your blood pressure at your visit was 140/90 or greater, please contact your primary care physician to follow up on this.  _______________________________________________________  If you are age 25 or older, your body mass index should be between 23-30. Your Body mass index is 29.52 kg/m. If this is out of the aforementioned range listed, please consider follow up with your Primary Care Provider.  If you are age 77 or younger, your body mass index should be between 19-25. Your Body mass index is 29.52 kg/m. If this is out of the aformentioned range listed, please consider follow up with your Primary Care Provider.   ________________________________________________________  The [facility] providers would like to encourage you to use MYCHART to communicate with providers for non-urgent requests or questions.  Due to long hold times on the telephone, sending your provider a message by [facility] may be a faster and more efficient way to get a response.  Please allow 48 business hours for a response.  Please remember that this is for non-urgent requests.   _______________________________________________________  [facility] is using a team-based approach to care.  Your team is made up of your doctor and two to three APPS. Our APPS (Nurse Practitioners and Physician Assistants) work with your physician to ensure care continuity for you. They are fully qualified to address your health concerns and develop a treatment plan. They communicate directly with your gastroenterologist to care for you. Seeing the Advanced Practice Practitioners on your physician's team can help you by facilitating care more promptly, often allowing for earlier appointments, access to diagnostic testing, procedures, and other specialty referrals.

## 2024-04-11 NOTE — Telephone Encounter (Signed)
 [facility] Pre-operative Risk Assessment     Request for surgical clearance:     Endoscopy Procedure  What type of surgery is being performed?     Endo/Colon  When is this surgery scheduled?     05/08/2024  What type of clearance is required ?   Pharmacy  Are there any medications that need to be held prior to surgery and how long? Eliquis  - THREE DAYS  Practice name and name of physician performing surgery?      [facility]  What is your office phone and fax number?      Phone- [phone]  Fax- [phone]  Anesthesia type (None, local, MAC, general) ?       MAC   Please route your response to [facility]

## 2024-04-11 NOTE — Progress Notes (Signed)
 HISTORY OF PRESENT ILLNESS:  Melanie Montes is a 88 y.o. female with multiple medical problems as listed below who presents herself for evaluation at the urgency of her PCP regarding the need for surveillance colonoscopy and upper GI complaints.  The patient has a history of paroxysmal atrial fibrillation for which she has undergone prior cardioversion and is currently on metoprolol  and Eliquis .  Last ejection fraction 2024 was 55 to 60%.  She also has a history of renal insufficiency.  Last identified creatinine 1.97, and 2023.  No airway issues on prior anesthesia evaluations.  I last saw the patient in this office in June 2021 regarding surveillance colonoscopy due to history of multiple adenomatous colon polyps and a strong family history colon cancer.  She was on Eliquis  time, which was interrupted for 3 days.  Colonoscopy completed March 27, 2020.  Patient was found to have 17 polyps, 16 of which which were adenomatous.  Follow-up in 1 year recommended.  The patient did not follow-up as requested due to her suffered a few broken bones after a fall in caring for her husband with dementia who passed away earlier this year.  She continues to be active.  She ambulates with a cane and drives an automobile.  Otherwise feels well.  No interval hospitalizations.  Upper GI symptoms that occurred this year include postprandial fullness.  Sometimes she will experience vomiting if she overeats.  It is unclear to me that this represents dysphagia.  Her weight seems to be stable.  She does tell me that she eats a lot of ice.  She is on B12 replacement.  REVIEW OF SYSTEMS:  All non-GI ROS negative unless otherwise stated in the HPI except for arthritis  Past Medical History:  Diagnosis Date   Abnormal CT scan, chest 05/02/2012   Followed in Pulmonary clinic/ [facility]/ [place]    - CT Chest 04/17/12:  Bilateral clustered nodules both upper lobes and sup segment of lower lobes      Anemia,  pernicious    Breast cancer (HCC)    Left   Breast cancer of upper-outer quadrant of left female breast (HCC) 01/19/2016   Carotid artery occlusion without infarction    Carotid stenosis 05/17/2018   Chest pain in adult 06/30/2015   Overview:  Overview:  Stress echo negative for ischemia at 7 mets July 2016 Overview:  Stress echo negative for ischemia at 7 mets July 2016   Chronic diastolic heart failure (HCC) 06/30/2015   CKD (chronic kidney disease) 06/30/2015   Colonic polyp    COPD (chronic obstructive pulmonary disease) (HCC) 06/02/2012   Followed in Pulmonary clinic/ [facility]/ [place]    - PFT's 06/02/2012 FEV1  1.72 (86%) ratio 68 with marked air trapping  And DLCO 67%    Cough 05/02/2012   Followed in Pulmonary clinic/ [facility]/ [place]     Degeneration of lumbar or lumbosacral intervertebral disc    Edema    Fibrocystic breast changes, bilateral 01/23/2018   GERD (gastroesophageal reflux disease)    HTN (hypertension), benign    Hyperlipidemia    Hypertensive heart and chronic kidney disease with heart failure and stage 1 through stage 4 chronic kidney disease, or unspecified chronic kidney disease (HCC) 06/30/2015   Hypothyroidism    Paroxysmal atrial fibrillation (HCC)    PAT (paroxysmal atrial tachycardia) (HCC) 12/13/2017   Restless legs     Past Surgical History:  Procedure Laterality Date   ABDOMINAL HYSTERECTOMY  1970   BREAST MASS  EXCISION  2009   left//malignant   CARDIOVERSION N/A 03/09/2018   Procedure: CARDIOVERSION;  Surgeon: Rolan Ezra RAMAN, MD;  Location: [facility] ENDOSCOPY;  Service: Cardiovascular;  Laterality: N/A;   CARDIOVERSION N/A 08/04/2018   Procedure: CARDIOVERSION;  Surgeon: Shlomo Wilbert SAUNDERS, MD;  Location: [facility] ENDOSCOPY;  Service: Cardiovascular;  Laterality: N/A;   COLONOSCOPY  03/27/2020   SPINE SURGERY  1980   lumbar    Social History Sherrilyn KATHEE Christ  reports that she quit smoking about 23 years ago. Her smoking use included cigarettes. She  started smoking about 43 years ago. She has a 20 pack-year smoking history. She has never used smokeless tobacco. She reports that she does not drink alcohol and does not use drugs.  family history includes Alzheimer's disease in her mother; Aneurysm in her mother; Colon cancer in her father and maternal aunt; Heart disease in her maternal grandmother and paternal grandmother; Stomach cancer in her paternal grandmother; Stroke in her mother; Transient ischemic attack in her father.  Allergies  Allergen Reactions   Penicillins Shortness Of Breath    Has patient had a PCN reaction causing immediate rash, facial/tongue/throat swelling, SOB or lightheadedness with hypotension: No Has patient had a PCN reaction causing severe rash involving mucus membranes or skin necrosis: No Has patient had a PCN reaction that required hospitalization: No Has patient had a PCN reaction occurring within the last 10 years: No If all of the above answers are NO, then may proceed with Cephalosporin use.     Prednisone     Other Reaction(s): Not available   Statins Other (See Comments)    myalgias Other reaction(s): Other (See Comments) myalgias myalgias       PHYSICAL EXAMINATION: Vital signs: BP 130/80   Pulse 80   Ht 5' 4 (1.626 m)   Wt 172 lb (78 kg)   BMI 29.52 kg/m   Constitutional: Pleasant elderly female, generally well-appearing given stated age, no acute distress Psychiatric: alert and oriented x3, cooperative Eyes: extraocular movements intact, anicteric, conjunctiva pink Mouth: oral pharynx moist, no lesions Neck: supple no lymphadenopathy Cardiovascular: heart regular rate and rhythm, no murmur Lungs: clear to auscultation bilaterally Abdomen: soft, obese, nontender, nondistended, no obvious ascites, no peritoneal signs, normal bowel sounds, no organomegaly Rectal: Deferred to colonoscopy Extremities: no clubbing, cyanosis, or lower extremity edema bilaterally Skin: no lesions on  visible extremities Neuro: No focal deficits.  Cranial nerves intact  ASSESSMENT:  1.  Personal history of multiple adenomatous colon polyps.  Overdue for surveillance 2.  Strong family history of colon cancer. 3.  Early satiety with intermittent vomiting.  Etiology unclear. 4.  Advanced age with multiple stable comorbidities as described.  Remains active.  Motivated. 5.  Craves ice.  Rule out iron deficiency   PLAN:  1.  Colonoscopy for colon polyp surveillance.  The patient is high risk given her age, comorbidities and the need to address anticoagulation.The nature of the procedure, as well as the risks, benefits, and alternatives were carefully and thoroughly reviewed with the patient. Ample time for discussion and questions allowed. The patient understood, was satisfied, and agreed to proceed. 2.  Schedule upper endoscopy with possible esophageal dilation.  The patient is high risk as above.The nature of the procedure, as well as the risks, benefits, and alternatives were carefully and thoroughly reviewed with the patient. Ample time for discussion and questions allowed. The patient understood, was satisfied, and agreed to proceed. 3.  Hold Eliquis  3 days prior to the procedures,  as previous.  The risks and benefits of interrupting anticoagulation were again reviewed with her.  She was familiar from previous exams. 4.  Obtain CBC, iron studies, and c-Met today 5.  Further recommendations to be determined after the above.  She may benefit from PPI. Total time of 65 minutes was spent preparing to see the patient, reviewing a myriad of records and data, obtaining comprehensive history, performing medically appropriate physical exam, ordering blood work, ordering multiple endoscopic procedures, directing medication adjustments, and documenting clinical information in the health record

## 2024-04-16 DIAGNOSIS — N6011 Diffuse cystic mastopathy of right breast: Secondary | ICD-10-CM | POA: Diagnosis not present

## 2024-04-16 DIAGNOSIS — N6012 Diffuse cystic mastopathy of left breast: Secondary | ICD-10-CM | POA: Diagnosis not present

## 2024-04-16 DIAGNOSIS — C50412 Malignant neoplasm of upper-outer quadrant of left female breast: Secondary | ICD-10-CM | POA: Diagnosis not present

## 2024-04-16 DIAGNOSIS — Z170 Estrogen receptor positive status [ER+]: Secondary | ICD-10-CM | POA: Diagnosis not present

## 2024-04-16 NOTE — Telephone Encounter (Signed)
 Patient with diagnosis of atrial fibrillation on Eliquis  for anticoagulation.    What type of surgery is being performed?     Endo/Colon  When is this surgery scheduled?     05/08/2024    CHA2DS2-VASc Score = 6   This indicates a 9.7% annual risk of stroke. The patient's score is based upon: CHF History: 1 HTN History: 1 Diabetes History: 0 Stroke History: 0 Vascular Disease History: 1 Age Score: 2 Gender Score: 1    CrCl 30 Platelet count 208  Patient has not had an Afib/aflutter ablation or Watchman within the last 3 months or DCCV within the last 30 days   Per office protocol, patient can hold Eliquis  for 3 days prior to procedure.   Patient will not need bridging with Lovenox (enoxaparin) around procedure.  **This guidance is not considered finalized until pre-operative APP has relayed final recommendations.**

## 2024-04-16 NOTE — Telephone Encounter (Signed)
   Patient Name: Melanie Montes  DOB: [birth year] MRN: [id number]  Primary Cardiologist: Redell Leiter, MD  Chart reviewed as part of pre-operative protocol coverage. Patient has an upcoming endoscopy/ colonoscopy planned. Cardiology was asked to give recommendations for holding Eliquis .   Per Pharmacy and office protocol: Patient can hold Eliquis  for 3 days prior to procedure.  Patient will not need bridging with Lovenox (enoxaparin) around procedure. Please restart Eliquis  as soon as safely possible afterwards.   I will route this recommendation to the requesting party via Epic fax function and remove from pre-op pool.  Please call with questions.  Leontina Skidmore E Chay Mazzoni, PA-C 04/16/2024, 2:48 PM

## 2024-04-17 NOTE — Telephone Encounter (Signed)
 Spoke with patient's daughter and told her, per cardiology, patient can hold Eliquis  for 3 days prior to her procedure.  She agreed

## 2024-05-08 ENCOUNTER — Encounter: Admitting: Internal Medicine

## 2024-05-09 DIAGNOSIS — I130 Hypertensive heart and chronic kidney disease with heart failure and stage 1 through stage 4 chronic kidney disease, or unspecified chronic kidney disease: Secondary | ICD-10-CM | POA: Diagnosis not present

## 2024-05-09 DIAGNOSIS — D638 Anemia in other chronic diseases classified elsewhere: Secondary | ICD-10-CM | POA: Diagnosis not present

## 2024-05-09 DIAGNOSIS — E039 Hypothyroidism, unspecified: Secondary | ICD-10-CM | POA: Diagnosis not present

## 2024-05-09 DIAGNOSIS — E785 Hyperlipidemia, unspecified: Secondary | ICD-10-CM | POA: Diagnosis not present

## 2024-05-09 DIAGNOSIS — N184 Chronic kidney disease, stage 4 (severe): Secondary | ICD-10-CM | POA: Diagnosis not present

## 2024-05-09 DIAGNOSIS — I5032 Chronic diastolic (congestive) heart failure: Secondary | ICD-10-CM | POA: Diagnosis not present

## 2024-05-09 DIAGNOSIS — Z23 Encounter for immunization: Secondary | ICD-10-CM | POA: Diagnosis not present

## 2024-05-09 DIAGNOSIS — D519 Vitamin B12 deficiency anemia, unspecified: Secondary | ICD-10-CM | POA: Diagnosis not present

## 2024-05-09 DIAGNOSIS — I4891 Unspecified atrial fibrillation: Secondary | ICD-10-CM | POA: Diagnosis not present

## 2024-05-09 DIAGNOSIS — G2581 Restless legs syndrome: Secondary | ICD-10-CM | POA: Diagnosis not present

## 2024-05-24 DIAGNOSIS — J449 Chronic obstructive pulmonary disease, unspecified: Secondary | ICD-10-CM | POA: Diagnosis not present

## 2024-06-27 ENCOUNTER — Other Ambulatory Visit: Payer: Self-pay | Admitting: Cardiology

## 2024-06-27 DIAGNOSIS — I4811 Longstanding persistent atrial fibrillation: Secondary | ICD-10-CM

## 2024-06-27 DIAGNOSIS — Z7901 Long term (current) use of anticoagulants: Secondary | ICD-10-CM

## 2024-06-28 NOTE — Telephone Encounter (Signed)
 Prescription refill request for Melanie Montes  received. Indication: afib  Last office visit: Melanie Montes, 02/14/2024 Scr:  1.65, 04/11/2024 Age: 88 yo  Weight: 78 kg   Refill sent.
